# Patient Record
Sex: Male | Born: 1945 | Race: White | Hispanic: No | Marital: Married | Smoking: Former smoker
Health system: Southern US, Community
[De-identification: ages and names within clinical notes are randomized; demographics above are authoritative.]

## PROBLEM LIST (undated history)

## (undated) DIAGNOSIS — G4733 Obstructive sleep apnea (adult) (pediatric): Secondary | ICD-10-CM

## (undated) DIAGNOSIS — J449 Chronic obstructive pulmonary disease, unspecified: Secondary | ICD-10-CM

## (undated) DIAGNOSIS — I10 Essential (primary) hypertension: Secondary | ICD-10-CM

## (undated) DIAGNOSIS — J9621 Acute and chronic respiratory failure with hypoxia: Secondary | ICD-10-CM

## (undated) DIAGNOSIS — I2729 Other secondary pulmonary hypertension: Secondary | ICD-10-CM

## (undated) DIAGNOSIS — G4730 Sleep apnea, unspecified: Secondary | ICD-10-CM

## (undated) DIAGNOSIS — I5032 Chronic diastolic (congestive) heart failure: Secondary | ICD-10-CM

## (undated) HISTORY — PX: CERVICAL LAMINECTOMY: SHX94

---

## 2010-10-11 ENCOUNTER — Encounter: Payer: Self-pay | Admitting: Emergency Medicine

## 2010-10-11 ENCOUNTER — Inpatient Hospital Stay (INDEPENDENT_AMBULATORY_CARE_PROVIDER_SITE_OTHER)
Admission: RE | Admit: 2010-10-11 | Discharge: 2010-10-11 | Disposition: A | Discharge status: Home / Self Care | Payer: Commercial Managed Care - PPO | Source: Ambulatory Visit | Attending: Emergency Medicine | Admitting: Emergency Medicine

## 2010-10-11 DIAGNOSIS — E119 Type 2 diabetes mellitus without complications: Secondary | ICD-10-CM | POA: Insufficient documentation

## 2010-10-11 DIAGNOSIS — J441 Chronic obstructive pulmonary disease with (acute) exacerbation: Secondary | ICD-10-CM

## 2010-10-11 DIAGNOSIS — J4489 Other specified chronic obstructive pulmonary disease: Secondary | ICD-10-CM

## 2010-10-11 DIAGNOSIS — J449 Chronic obstructive pulmonary disease, unspecified: Secondary | ICD-10-CM | POA: Insufficient documentation

## 2010-10-11 LAB — CONVERTED CEMR LAB: Blood Glucose, Fingerstick: 134

## 2011-01-14 NOTE — Progress Notes (Signed)
Summary: Posssible Bronchitis (rm 2)   Vital Signs:  Patient Profile:   65 Years Old Male CC:      productive cough, SOB, fatigue, wheezing x 4 days Height:     69 inches Weight:      331 pounds O2 Sat:      68 % O2 treatment:    Room Air Temp:     98.1 degrees F oral Pulse rate:   81 / minute Resp:     30 per minute BP sitting:   98 / 63  (left arm) Cuff size:   large  Vitals Entered By: Lajean Saver RN (October 11, 2010 11:37 AM)             CBG Result 134      Updated Prior Medication List: LISINOPRIL-HYDROCHLOROTHIAZIDE 20-12.5 MG TABS (LISINOPRIL-HYDROCHLOROTHIAZIDE) once daily LASIX 40 MG TABS (FUROSEMIDE) once daily SIMVASTATIN 10 MG TABS (SIMVASTATIN) once daily GLIPIZIDE 10 MG XR24H-TAB (GLIPIZIDE) once daily ASPIRIN LOW STRENGTH 81 MG CHEW (ASPIRIN) once daily SPIRIVA HANDIHALER 18 MCG CAPS (TIOTROPIUM BROMIDE MONOHYDRATE)  ALBUTEROL SULFATE (5 MG/ML) 0.5% NEBU (ALBUTEROL SULFATE) prn  Current Allergies: No known allergies History of Present Illness History from: patient Chief Complaint: productive cough, SOB, fatigue, wheezing x 4 days History of Present Illness: Pt complains of four days of worsening productive cough, dyspnea, fatigue and wheezing. He has chronic COPD, on home oxygen. His usual oxygen saturation on room air is 92%. His PCP is Dr. Pia Mau at [facility] family [facility], and he gets these kind of flare ups about once a year, and he is usually prescribed an antibiotic, prednisone dose pack, and given a shot of an antibiotic and steroid. He uses a home nebulizer also, but has not been using it recently. Denies nausea or vomiting. Denies leg pain or edema. No chest pain. He states that he absolutely refuses a chest x-ray or hospitalization under any circumstances, even if his condition were to worsen and even if he were to die from his condition. No sore throat. + cough, + dyspnea. No exertional chest pain. positive wheezing.  No nausea No  vomiting. + fever, No chills has type II diabetes is controlled, CBG today in the office 134.   REVIEW OF SYSTEMS Constitutional Symptoms       Complains of chills and fatigue.     Denies fever, night sweats, weight loss, and weight gain.  Eyes       Denies change in vision, eye pain, eye discharge, glasses, contact lenses, and eye surgery. Ear/Nose/Throat/Mouth       Complains of frequent runny nose.      Denies hearing loss/aids, change in hearing, ear pain, ear discharge, dizziness, frequent nose bleeds, sinus problems, sore throat, hoarseness, and tooth pain or bleeding.  Respiratory       Complains of productive cough, wheezing, and shortness of breath.      Denies dry cough, asthma, bronchitis, and emphysema/COPD.  Cardiovascular       Denies murmurs, chest pain, and tires easily with exhertion.    Gastrointestinal       Denies stomach pain, nausea/vomiting, diarrhea, constipation, blood in bowel movements, and indigestion. Genitourniary       Denies painful urination, kidney stones, and loss of urinary control. Neurological       Complains of headaches.      Denies paralysis, seizures, and fainting/blackouts. Musculoskeletal       Denies muscle pain, joint pain, joint stiffness, decreased range of motion, redness, swelling, muscle  weakness, and gout.  Skin       Denies bruising, unusual mles/lumps or sores, and hair/skin or nail changes.  Psych       Denies mood changes, temper/anger issues, anxiety/stress, speech problems, depression, and sleep problems. Other Comments: Initial 02sat 68% Placed on NRB, 02 increased to 95%. Symptoms x 4 days. Wheezing in both lungs   Past History:  Past Medical History: COPD Diabetes mellitus, type II CPAP  Past Surgical History: Tonsillectomy cervical surgery  Social History: Married Current Smoker 1PPD x 20 years Alcohol use-no Drug use-no Regular exercise-no Smoking Status:  current Drug Use:  no Does Patient Exercise:   no Physical Exam General appearance: initially, very dyspneic, but alert, not toxic. Placed on 02 immediately NRB, and O2 saturation improved to 95% Head: normocephalic, atraumatic Eyes: conjunctivae and lids normal Ears: normal, no lesions or deformities Nasal: mucosa pink, nonedematous, no septal deviation, turbinates normal Oral/Pharynx: tongue normal, posterior pharynx without erythema or exudate Neck: neck supple,  trachea midline, no masses. no JVD Chest/Lungs: scattered wheezes throughout all lobes. Scattered rhonchi throughout. Initially, poor air movement. No rales. Heart: regular rate and  rhythm, no murmur Abdomen: soft, non-tender without obvious organomegaly Extremities: normal extremities. No calf  tenderness or edema Skin: no obvious rashes or lesions MSE: oriented to time, place, and person Assessment New Problems: CHRONIC OBSTRUCTIVE PULMONARY DISEASE, ACUTE EXACERBATION (ICD-491.21) DIABETES MELLITUS, TYPE II (ICD-250.00) COPD (ICD-496)   Plan New Orders: Pulse Oximetry [94760] Admin of Therapeutic Inj  intramuscular or subcutaneous [96372] Nebulizer Tx [94640] Albuterol up to 2.5mg  with Ipratropium [J7620] Depo- Medrol 80mg  [J1040] New Patient Level IV [99204] Capillary Blood Glucose/CBG [82948] Rocephin  250mg  [J0696] Planning Comments:   Depo-Medrol 80 mg IM.  Oxygen, 6 L per minute, via non re-breathing mask. duoNeb Neb tx. Patient reevaluated afterward, his breathing significantly improved. Has my late exp wheezes and few rhonchi afterward but air movement much improved.  no rales. On his portable oxygen, 4 L per minute via nasal cannula, we rechecked his oxygen saturation which stabilized at 94%, in my opinion he was stabilized to go home at his request. Again, he absolutely refused any further treatment, although I explained the risks. He voiced understanding. Rocephin 1 g I M. I prescribed Levaquin and prednisone dose pack. continue home nebulizers as  before. Continue home oxygen, titrate to oxygen saturation as usual in the 92-93% range.  Follow Up: Dr, Pia Mau at [facility] family practice tomorrow. If any worsening in the meantime, go to ER stat.  The patient and/or caregiver has been counseled thoroughly with regard to medications prescribed including dosage, schedule, interactions, rationale for use, and possible side effects and they verbalize understanding.  Diagnoses and expected course of recovery discussed and will return if not improved as expected or if the condition worsens. Patient and/or caregiver verbalized understanding.   Medication Administration  Injection # 1:    Medication: Depo- Medrol 80mg     Diagnosis: COPD (ICD-496)    Route: IM    Site: R deltoid    Exp Date: 01/12/2011    Lot #: [id number]    Mfr: Pharmacia    Patient tolerated injection without complications    Given by: Lajean Saver RN (October 11, 2010 12:40 PM)  Injection # 2:    Medication: Rocephin  250mg     Diagnosis: DIABETES MELLITUS, TYPE II (ICD-250.00)    Route: IM    Site: RUOQ gluteus    Exp Date: 02/11/2013    Lot #: [id number]  Mfr: sandoz    Patient tolerated injection without complications    Given by: Lajean Saver RN (October 11, 2010 1:31 PM)  Orders Added: 1)  Pulse Oximetry [94760] 2)  Admin of Therapeutic Inj  intramuscular or subcutaneous [96372] 3)  Nebulizer Tx [86578] 4)  Albuterol up to 2.5mg  with Ipratropium [J7620] 5)  Depo- Medrol 80mg  [J1040] 6)  New Patient Level IV [99204] 7)  Capillary Blood Glucose/CBG [82948] 8)  Rocephin  250mg  [J0696]    Laboratory Results   Blood Tests   Date/Time Received: October 11, 2010 12:10 PM  Date/Time Reported: October 11, 2010 12:10 PM   CBG Random:: 134mg /dL       Appended Document: Posssible Bronchitis (rm 2) clarifications of Rx's handwritten and visit: Levaquin 500 mg,  #10,  no refills, Sig: 1 by mouth q day X 10 days. Prednisone 10 mg. , # 21. No rf. Taper dose  pack times six days.

## 2011-06-15 ENCOUNTER — Emergency Department (HOSPITAL_BASED_OUTPATIENT_CLINIC_OR_DEPARTMENT_OTHER)
Admission: EM | Admit: 2011-06-15 | Discharge: 2011-06-16 | Disposition: A | Discharge status: Home / Self Care | Payer: Medicare Other | Attending: Emergency Medicine | Admitting: Emergency Medicine

## 2011-06-15 ENCOUNTER — Encounter (HOSPITAL_BASED_OUTPATIENT_CLINIC_OR_DEPARTMENT_OTHER): Payer: Self-pay | Admitting: *Deleted

## 2011-06-15 DIAGNOSIS — J4489 Other specified chronic obstructive pulmonary disease: Secondary | ICD-10-CM | POA: Insufficient documentation

## 2011-06-15 DIAGNOSIS — E119 Type 2 diabetes mellitus without complications: Secondary | ICD-10-CM | POA: Insufficient documentation

## 2011-06-15 DIAGNOSIS — Z794 Long term (current) use of insulin: Secondary | ICD-10-CM | POA: Insufficient documentation

## 2011-06-15 DIAGNOSIS — Z79899 Other long term (current) drug therapy: Secondary | ICD-10-CM | POA: Insufficient documentation

## 2011-06-15 DIAGNOSIS — G4730 Sleep apnea, unspecified: Secondary | ICD-10-CM | POA: Insufficient documentation

## 2011-06-15 DIAGNOSIS — Y92009 Unspecified place in unspecified non-institutional (private) residence as the place of occurrence of the external cause: Secondary | ICD-10-CM | POA: Insufficient documentation

## 2011-06-15 DIAGNOSIS — W268XXA Contact with other sharp object(s), not elsewhere classified, initial encounter: Secondary | ICD-10-CM | POA: Insufficient documentation

## 2011-06-15 DIAGNOSIS — J449 Chronic obstructive pulmonary disease, unspecified: Secondary | ICD-10-CM | POA: Insufficient documentation

## 2011-06-15 DIAGNOSIS — S81809A Unspecified open wound, unspecified lower leg, initial encounter: Secondary | ICD-10-CM | POA: Insufficient documentation

## 2011-06-15 DIAGNOSIS — Y93H2 Activity, gardening and landscaping: Secondary | ICD-10-CM | POA: Insufficient documentation

## 2011-06-15 DIAGNOSIS — I10 Essential (primary) hypertension: Secondary | ICD-10-CM | POA: Insufficient documentation

## 2011-06-15 DIAGNOSIS — S81812A Laceration without foreign body, left lower leg, initial encounter: Secondary | ICD-10-CM

## 2011-06-15 DIAGNOSIS — S81009A Unspecified open wound, unspecified knee, initial encounter: Secondary | ICD-10-CM | POA: Insufficient documentation

## 2011-06-15 HISTORY — DX: Chronic obstructive pulmonary disease, unspecified: J44.9

## 2011-06-15 HISTORY — DX: Essential (primary) hypertension: I10

## 2011-06-15 HISTORY — DX: Sleep apnea, unspecified: G47.30

## 2011-06-15 HISTORY — DX: Other secondary pulmonary hypertension: I27.29

## 2011-06-15 MED ORDER — LIDOCAINE HCL 2 % IJ SOLN
20.0000 mL | Freq: Once | INTRAMUSCULAR | Status: AC
  Administered 2011-06-15: 240 mg

## 2011-06-15 MED ORDER — TETANUS-DIPHTH-ACELL PERTUSSIS 5-2.5-18.5 LF-MCG/0.5 IM SUSP
0.5000 mL | Freq: Once | INTRAMUSCULAR | Status: AC
  Administered 2011-06-15: 0.5 mL via INTRAMUSCULAR
  Filled 2011-06-15: qty 0.5

## 2011-06-15 MED ORDER — LIDOCAINE HCL 2 % IJ SOLN
INTRAMUSCULAR | Status: AC
  Administered 2011-06-15: 240 mg
  Filled 2011-06-15: qty 1

## 2011-06-15 NOTE — ED Notes (Signed)
Patient was breathing heavy in triage and had low pulse ox of 79-89%. Patient stated he has oxygen in his car but not with him. Patient stated he is normally on 3 litres at home.

## 2011-06-15 NOTE — ED Notes (Signed)
Pt states that while mowing this Pm he got caught on a briar bush presents with cut on left lower leg. Pt with large amount of fluid seeping from leg

## 2011-06-15 NOTE — Discharge Instructions (Signed)
Take an extra dose of your lasix this evening and elevate legs as often as possible.  Follow up with your primary care doctor or [facility] [phone]; [address])  in 7-10 days for wound recheck and suture removal.  You should be seen sooner if you develop fever, worsening pain or redness/drainage of pus at site of wound.   You should also be seen sooner if you develop worse than baseline shortness of breath or chest pain. Laceration Care, Adult A laceration is a cut or lesion that goes through all layers of the skin and into the tissue just beneath the skin. TREATMENT  Some lacerations may not require closure. Some lacerations may not be able to be closed due to an increased risk of infection. It is important to see your caregiver as soon as possible after an injury to minimize the risk of infection and maximize the opportunity for successful closure. If closure is appropriate, pain medicines may be given, if needed. The wound will be cleaned to help prevent infection. Your caregiver will use stitches (sutures), staples, wound glue (adhesive), or skin adhesive strips to repair the laceration. These tools bring the skin edges together to allow for faster healing and a better cosmetic outcome. However, all wounds will heal with a scar. Once the wound has healed, scarring can be minimized by covering the wound with sunscreen during the day for 1 full year. HOME CARE INSTRUCTIONS  For sutures or staples:  Keep the wound clean and dry.   If you were given a bandage (dressing), you should change it at least once a day. Also, change the dressing if it becomes wet or dirty, or as directed by your caregiver.   Wash the wound with soap and water 2 times a day. Rinse the wound off with water to remove all soap. Pat the wound dry with a clean towel.   After cleaning, apply a thin layer of the antibiotic ointment as recommended by your caregiver. This will help prevent infection and keep the  dressing from sticking.   You may shower as usual after the first 24 hours. Do not soak the wound in water until the sutures are removed.   Only take over-the-counter or prescription medicines for pain, discomfort, or fever as directed by your caregiver.   Get your sutures or staples removed as directed by your caregiver.  For skin adhesive strips:  Keep the wound clean and dry.   Do not get the skin adhesive strips wet. You may bathe carefully, using caution to keep the wound dry.   If the wound gets wet, pat it dry with a clean towel.   Skin adhesive strips will fall off on their own. You may trim the strips as the wound heals. Do not remove skin adhesive strips that are still stuck to the wound. They will fall off in time.  For wound adhesive:  You may briefly wet your wound in the shower or bath. Do not soak or scrub the wound. Do not swim. Avoid periods of heavy perspiration until the skin adhesive has fallen off on its own. After showering or bathing, gently pat the wound dry with a clean towel.   Do not apply liquid medicine, cream medicine, or ointment medicine to your wound while the skin adhesive is in place. This may loosen the film before your wound is healed.   If a dressing is placed over the wound, be careful not to apply tape directly over  the skin adhesive. This may cause the adhesive to be pulled off before the wound is healed.   Avoid prolonged exposure to sunlight or tanning lamps while the skin adhesive is in place. Exposure to ultraviolet light in the first year will darken the scar.   The skin adhesive will usually remain in place for 5 to 10 days, then naturally fall off the skin. Do not pick at the adhesive film.  You may need a tetanus shot if:  You cannot remember when you had your last tetanus shot.   You have never had a tetanus shot.  If you get a tetanus shot, your arm may swell, get red, and feel warm to the touch. This is common and not a problem. If  you need a tetanus shot and you choose not to have one, there is a rare chance of getting tetanus. Sickness from tetanus can be serious. SEEK MEDICAL CARE IF:   You have redness, swelling, or increasing pain in the wound.   You see a red line that goes away from the wound.   You have yellowish-white fluid (pus) coming from the wound.   You have a fever.   You notice a bad smell coming from the wound or dressing.   Your wound breaks open before or after sutures have been removed.   You notice something coming out of the wound such as wood or glass.   Your wound is on your hand or foot and you cannot move a finger or toe.  SEEK IMMEDIATE MEDICAL CARE IF:   Your pain is not controlled with prescribed medicine.   You have severe swelling around the wound causing pain and numbness or a change in color in your arm, hand, leg, or foot.   Your wound splits open and starts bleeding.   You have worsening numbness, weakness, or loss of function of any joint around or beyond the wound.   You develop painful lumps near the wound or on the skin anywhere on your body.  MAKE SURE YOU:   Understand these instructions.   Will watch your condition.   Will get help right away if you are not doing well or get worse.  Document Released: 01/28/2005 Document Revised: 01/17/2011 Document Reviewed: 07/24/2010 [facility] Patient Information 2012 [place], [place].

## 2011-06-15 NOTE — ED Provider Notes (Signed)
History     CSN: [id number]  Arrival date & time 06/15/11  4098   First MD Initiated Contact with Patient 06/15/11 1956      Chief Complaint  Patient presents with  . Extremity Laceration    (Consider location/radiation/quality/duration/timing/severity/associated sxs/prior treatment) HPI History provided by pt.   Pt was mowing the lawn today and left lower leg brushed up against a rose bush.  Sustained a laceration lateral aspect of leg.  Pain and bleeding currently controlled.  His wife reports that she cleaned wound w/ water.  No associated sx.   Last tetanus unknown.  Pt has chronic peripheral edema secondary to CHF.  Swelling worse today than normal.  Has been compliant w/ his lasix and does not believe he has increased his salt intake recently.   Denies cough and SOB.    Past Medical History  Diagnosis Date  . Hypertension   . COPD (chronic obstructive pulmonary disease)   . Heart failure   . Pulmonary hypertensive venous disease   . Diabetes mellitus   . Sleep apnea     Past Surgical History  Procedure Date  . Cervical laminectomy     History reviewed. No pertinent family history.  History  Substance Use Topics  . Smoking status: Former Games developer  . Smokeless tobacco: Not on file  . Alcohol Use: No      Review of Systems  All other systems reviewed and are negative.    Allergies  Review of patient's allergies indicates no known allergies.  Home Medications   Current Outpatient Rx  Name Route Sig Dispense Refill  . ACETAMINOPHEN 500 MG PO TABS Oral Take 1,000 mg by mouth every 6 (six) hours as needed. For pain    . ALBUTEROL SULFATE HFA 108 (90 BASE) MCG/ACT IN AERS Inhalation Inhale 2 puffs into the lungs every 6 (six) hours as needed. For shortness of breath    . ASPIRIN 81 MG PO TABS Oral Take 81 mg by mouth daily.    . BUDESONIDE-FORMOTEROL FUMARATE 80-4.5 MCG/ACT IN AERO Inhalation Inhale 2 puffs into the lungs 2 (two) times daily.    . FUROSEMIDE 40  MG PO TABS Oral Take 40 mg by mouth daily.    Marland Kitchen GLIPIZIDE ER 10 MG PO TB24 Oral Take 10 mg by mouth daily.    . INSULIN ASPART 100 UNIT/ML [place] SOLN Subcutaneous Inject 2-4 Units into the skin 3 (three) times daily before meals. According to sliding scale    . INSULIN DETEMIR 100 UNIT/ML [place] SOLN Subcutaneous Inject 7 Units into the skin at bedtime.    Marland Kitchen LISINOPRIL-HYDROCHLOROTHIAZIDE 10-12.5 MG PO TABS Oral Take 1 tablet by mouth daily.    . ADULT MULTIVITAMIN W/MINERALS CH Oral Take 1 tablet by mouth daily.    Marland Kitchen SIMVASTATIN 10 MG PO TABS Oral Take 10 mg by mouth daily.    Marland Kitchen TIOTROPIUM BROMIDE MONOHYDRATE 18 MCG IN CAPS Inhalation Place 18 mcg into inhaler and inhale daily.    Marland Kitchen PREDNISONE (PAK) PO Oral Take by mouth daily. Taper dose completed on Monday 06/10/11      BP 142/55  Pulse 93  Temp 97.5 F (36.4 C)  Resp 24  SpO2 94%  Physical Exam  Nursing note and vitals reviewed. Constitutional: He is oriented to person, place, and time. He appears well-developed and well-nourished. No distress.  HENT:  Head: Normocephalic and atraumatic.  Eyes:       Normal appearance  Neck: Normal range of motion.  Pulmonary/Chest:  Effort normal.  Musculoskeletal: Normal range of motion.  Neurological: He is alert and oriented to person, place, and time.  Skin:       Skin discoloration and thickening bilateral anterior lower legs, consistent w/ chronic venous insufficiency.  2+ pitting edema, worse on left than right.  Pt reports that left lower leg is always more edematous.  Jagged linear, subcutaneous laceration mid-left lateral lower leg.  Hemostatic but weeping clear fluid.  Mildly ttp.   Psychiatric: He has a normal mood and affect. His behavior is normal.    ED Course  Procedures (including critical care time)  LACERATION REPAIR Performed by: Otilio Miu Authorized by: Otilio Miu Consent: Verbal consent obtained. Risks and benefits: risks, benefits and alternatives  were discussed Consent given by: patient Patient identity confirmed: provided demographic data Prepped and Draped in normal sterile fashion Wound explored  Laceration Location: left anterior lower leg  Laceration Length: 10cm  No Foreign Bodies seen or palpated  Anesthesia: local infiltration  Local anesthetic: lidocaine 2% w/out epinephrine  Anesthetic total: 12 ml  Irrigation method: syringe Amount of cleaning: standard  Skin closure: prolene 4.0  Number of sutures: 12  Technique: simple interrupted  Patient tolerance: Patient tolerated the procedure well with no immediate complications.  Labs Reviewed - No data to display No results found.   1. Laceration of left lower leg       MDM  Pt presents w/ laceration left anterior lower leg.  Wound cleaned and sutured.  Tetanus updated.  On exam, significant pitting edema of bilateral LEs, left worse than right.  Pt reports that edema is always worse on the left, but edema both legs is worse than normal today.  H/o CHF for which he takes 40mg  lasix qam.   Has been compliant w/ meds and denies cough and worse than baseline SOB.  Recommended that he take a second dose of his lasix when he returns home this evening, elevate legs and f/u with his PCP if swelling or dyspnea worsens.          Otilio Miu, [place] 06/16/11 [phone]

## 2011-06-15 NOTE — ED Notes (Signed)
[place] EDPA at bedside to suture

## 2011-06-16 NOTE — ED Provider Notes (Signed)
Medical screening examination/treatment/procedure(s) were performed by non-physician practitioner and as supervising physician I was immediately available for consultation/collaboration.   Gavin Pound. Oletta Lamas, MD 06/16/11 2049

## 2018-04-24 ENCOUNTER — Inpatient Hospital Stay
Admission: RE | Admit: 2018-04-24 | Discharge: 2018-05-15 | Disposition: A | Discharge status: Skilled Nursing Facility | Payer: Medicare Other | Attending: Internal Medicine | Admitting: Internal Medicine

## 2018-04-24 DIAGNOSIS — I2729 Other secondary pulmonary hypertension: Secondary | ICD-10-CM | POA: Diagnosis present

## 2018-04-24 DIAGNOSIS — J9690 Respiratory failure, unspecified, unspecified whether with hypoxia or hypercapnia: Secondary | ICD-10-CM

## 2018-04-24 DIAGNOSIS — J9621 Acute and chronic respiratory failure with hypoxia: Secondary | ICD-10-CM | POA: Diagnosis present

## 2018-04-24 DIAGNOSIS — I5032 Chronic diastolic (congestive) heart failure: Secondary | ICD-10-CM | POA: Diagnosis present

## 2018-04-24 DIAGNOSIS — I509 Heart failure, unspecified: Secondary | ICD-10-CM

## 2018-04-24 DIAGNOSIS — J449 Chronic obstructive pulmonary disease, unspecified: Secondary | ICD-10-CM | POA: Diagnosis present

## 2018-04-24 DIAGNOSIS — T17908A Unspecified foreign body in respiratory tract, part unspecified causing other injury, initial encounter: Secondary | ICD-10-CM

## 2018-04-24 DIAGNOSIS — G4733 Obstructive sleep apnea (adult) (pediatric): Secondary | ICD-10-CM | POA: Diagnosis present

## 2018-04-24 HISTORY — DX: Acute and chronic respiratory failure with hypoxia: J96.21

## 2018-04-24 HISTORY — DX: Chronic diastolic (congestive) heart failure: I50.32

## 2018-04-24 HISTORY — DX: Other secondary pulmonary hypertension: I27.29

## 2018-04-24 HISTORY — DX: Obstructive sleep apnea (adult) (pediatric): G47.33

## 2018-04-24 LAB — BLOOD GAS, ARTERIAL
ACID-BASE EXCESS: 12.6 mmol/L — AB (ref 0.0–2.0)
Bicarbonate: 37.8 mmol/L — ABNORMAL HIGH (ref 20.0–28.0)
O2 Content: 10 L/min
O2 Saturation: 92.6 %
PATIENT TEMPERATURE: 98.6
PH ART: 7.406 (ref 7.350–7.450)
pCO2 arterial: 61.5 mmHg — ABNORMAL HIGH (ref 32.0–48.0)
pO2, Arterial: 66.1 mmHg — ABNORMAL LOW (ref 83.0–108.0)

## 2018-04-24 MED ORDER — FAMOTIDINE 20 MG PO TABS
20.00 | ORAL_TABLET | ORAL | Status: DC
Start: 2018-04-24 — End: 2018-04-24

## 2018-04-24 MED ORDER — NITROGLYCERIN 0.4 MG SL SUBL
0.40 | SUBLINGUAL_TABLET | SUBLINGUAL | Status: DC
Start: ? — End: 2018-04-24

## 2018-04-24 MED ORDER — POLYETHYLENE GLYCOL 3350 17 G PO PACK
17.00 | PACK | ORAL | Status: DC
Start: ? — End: 2018-04-24

## 2018-04-24 MED ORDER — SODIUM CHLORIDE 0.9 % IV SOLN
10.00 | INTRAVENOUS | Status: DC
Start: ? — End: 2018-04-24

## 2018-04-24 MED ORDER — CLOTRIMAZOLE-BETAMETHASONE 1-0.05 % EX CREA
TOPICAL_CREAM | CUTANEOUS | Status: DC
Start: 2018-04-24 — End: 2018-04-24

## 2018-04-24 MED ORDER — IPRATROPIUM-ALBUTEROL 0.5-2.5 (3) MG/3ML IN SOLN
3.00 | RESPIRATORY_TRACT | Status: DC
Start: ? — End: 2018-04-24

## 2018-04-24 MED ORDER — FERROUS SULFATE 325 (65 FE) MG PO TABS
325.00 | ORAL_TABLET | ORAL | Status: DC
Start: 2018-04-25 — End: 2018-04-24

## 2018-04-24 MED ORDER — POLYETHYLENE GLYCOL 3350 17 G PO PACK
17.00 | PACK | ORAL | Status: DC
Start: 2018-04-25 — End: 2018-04-24

## 2018-04-24 MED ORDER — INSULIN GLARGINE 100 UNIT/ML ~~LOC~~ SOLN
1.00 | SUBCUTANEOUS | Status: DC
Start: ? — End: 2018-04-24

## 2018-04-24 MED ORDER — GENERIC EXTERNAL MEDICATION
1.00 | Status: DC
Start: ? — End: 2018-04-24

## 2018-04-24 MED ORDER — ENOXAPARIN SODIUM 40 MG/0.4ML ~~LOC~~ SOLN
40.00 | SUBCUTANEOUS | Status: DC
Start: 2018-04-24 — End: 2018-04-24

## 2018-04-24 MED ORDER — ASPIRIN 81 MG PO CHEW
81.00 | CHEWABLE_TABLET | ORAL | Status: DC
Start: 2018-04-25 — End: 2018-04-24

## 2018-04-24 MED ORDER — GENERIC EXTERNAL MEDICATION
10.00 | Status: DC
Start: ? — End: 2018-04-24

## 2018-04-24 MED ORDER — BENZONATATE 100 MG PO CAPS
100.00 | ORAL_CAPSULE | ORAL | Status: DC
Start: ? — End: 2018-04-24

## 2018-04-24 MED ORDER — FUROSEMIDE 10 MG/ML IJ SOLN
40.00 | INTRAMUSCULAR | Status: DC
Start: 2018-04-25 — End: 2018-04-24

## 2018-04-24 MED ORDER — GENERIC EXTERNAL MEDICATION
3.38 | Status: DC
Start: 2018-04-25 — End: 2018-04-24

## 2018-04-24 MED ORDER — BUDESONIDE-FORMOTEROL FUMARATE 80-4.5 MCG/ACT IN AERO
2.00 | INHALATION_SPRAY | RESPIRATORY_TRACT | Status: DC
Start: 2018-04-24 — End: 2018-04-24

## 2018-04-24 MED ORDER — INSULIN LISPRO 100 UNIT/ML ~~LOC~~ SOLN
1.00 | SUBCUTANEOUS | Status: DC
Start: 2018-04-24 — End: 2018-04-24

## 2018-04-24 MED ORDER — LORAZEPAM 2 MG/ML IJ SOLN
1.00 | INTRAMUSCULAR | Status: DC
Start: ? — End: 2018-04-24

## 2018-04-24 MED ORDER — TEMAZEPAM 7.5 MG PO CAPS
7.50 | ORAL_CAPSULE | ORAL | Status: DC
Start: ? — End: 2018-04-24

## 2018-04-24 MED ORDER — GENERIC EXTERNAL MEDICATION
650.00 | Status: DC
Start: ? — End: 2018-04-24

## 2018-04-24 MED ORDER — GENERIC EXTERNAL MEDICATION
4.00 | Status: DC
Start: ? — End: 2018-04-24

## 2018-04-24 MED ORDER — INSULIN LISPRO 100 UNIT/ML ~~LOC~~ SOLN
1.00 | SUBCUTANEOUS | Status: DC
Start: ? — End: 2018-04-24

## 2018-04-24 MED ORDER — ACETAMINOPHEN 325 MG PO TABS
650.00 | ORAL_TABLET | ORAL | Status: DC
Start: ? — End: 2018-04-24

## 2018-04-25 ENCOUNTER — Other Ambulatory Visit (HOSPITAL_COMMUNITY): Payer: Self-pay

## 2018-04-25 LAB — URINALYSIS, ROUTINE W REFLEX MICROSCOPIC
Bilirubin Urine: NEGATIVE
Glucose, UA: NEGATIVE mg/dL
Ketones, ur: NEGATIVE mg/dL
Nitrite: NEGATIVE
Protein, ur: NEGATIVE mg/dL
Specific Gravity, Urine: 1.014 (ref 1.005–1.030)
pH: 7 (ref 5.0–8.0)

## 2018-04-25 LAB — COMPREHENSIVE METABOLIC PANEL
ALT: 24 U/L (ref 0–44)
AST: 30 U/L (ref 15–41)
Albumin: 2.8 g/dL — ABNORMAL LOW (ref 3.5–5.0)
Alkaline Phosphatase: 78 U/L (ref 38–126)
Anion gap: 11 (ref 5–15)
BUN: 18 mg/dL (ref 8–23)
CO2: 33 mmol/L — ABNORMAL HIGH (ref 22–32)
Calcium: 9.1 mg/dL (ref 8.9–10.3)
Chloride: 91 mmol/L — ABNORMAL LOW (ref 98–111)
Creatinine, Ser: 0.7 mg/dL (ref 0.61–1.24)
GFR calc Af Amer: >60 mL/min (ref >60)
GFR calc non Af Amer: >60 mL/min (ref >60)
Glucose, Bld: 179 mg/dL — ABNORMAL HIGH (ref 70–99)
Potassium: 3.9 mmol/L (ref 3.5–5.1)
Sodium: 135 mmol/L (ref 135–145)
Total Bilirubin: 0.4 mg/dL (ref 0.3–1.2)
Total Protein: 7.7 g/dL (ref 6.5–8.1)

## 2018-04-25 LAB — CBC
HCT: 35.7 % — ABNORMAL LOW (ref 39.0–52.0)
Hemoglobin: 10.8 g/dL — ABNORMAL LOW (ref 13.0–17.0)
MCH: 26.9 pg (ref 26.0–34.0)
MCHC: 30.3 g/dL (ref 30.0–36.0)
MCV: 89 fL (ref 80.0–100.0)
Platelets: 420 10*3/uL — ABNORMAL HIGH (ref 150–400)
RBC: 4.01 MIL/uL — ABNORMAL LOW (ref 4.22–5.81)
RDW: 17 % — ABNORMAL HIGH (ref 11.5–15.5)
WBC: 16.8 10*3/uL — ABNORMAL HIGH (ref 4.0–10.5)
nRBC: 0 % (ref 0.0–0.2)

## 2018-04-26 LAB — URINE CULTURE: Culture: <10000 — AB

## 2018-04-27 ENCOUNTER — Encounter: Payer: Self-pay | Admitting: Internal Medicine

## 2018-04-27 DIAGNOSIS — G4733 Obstructive sleep apnea (adult) (pediatric): Secondary | ICD-10-CM | POA: Diagnosis present

## 2018-04-27 DIAGNOSIS — I5032 Chronic diastolic (congestive) heart failure: Secondary | ICD-10-CM | POA: Diagnosis not present

## 2018-04-27 DIAGNOSIS — J9621 Acute and chronic respiratory failure with hypoxia: Secondary | ICD-10-CM | POA: Diagnosis not present

## 2018-04-27 DIAGNOSIS — J449 Chronic obstructive pulmonary disease, unspecified: Secondary | ICD-10-CM | POA: Diagnosis not present

## 2018-04-27 DIAGNOSIS — I2729 Other secondary pulmonary hypertension: Secondary | ICD-10-CM

## 2018-04-27 NOTE — Consult Note (Signed)
Pulmonary Critical Care Medicine [facility] [place]  PULMONARY SERVICE  Date of Service: 04/27/2018  PULMONARY CRITICAL CARE Wesley Rios  GSU:[id number]  DOB: 1945-09-04   DOA: 04/24/2018  Referring Physician: Carron Curie, MD  HPI: Wesley Rios is a 73 y.o. male seen for follow up of Acute on Chronic Respiratory Failure.  Patient has multiple medical problems including morbid obesity with sleep apnea hypoventilation syndrome chronic diastolic heart failure recurrent pneumonia hyperlipidemia hypertension aortic stenosis renal mass presumed to be renal cell carcinoma.  Patient apparently presented because of increasing weakness and difficulty getting around.  Apparently took a fall on the day of admission.  Patient also had been having a cough and some chills.  When he was evaluated he was noted to have an elevated white count and he was afebrile.  Chest x-ray showed some mild lower lobe opacities.  On initial evaluation he was found to have an elevated BNP of 1037 and oxygen requirements were still going up and saturations remained about 88%.  Patient was aggressively treated diuresed x-ray showed some improvement but his oxygen requirements went up still.  And he does have a history of noncompliance with OSA CPAP so was transferred to our facility because of the high oxygen requirements.  Goals of care were also discussed at the other facility and the wife was not ready yet to give up.  Review of Systems:  ROS performed and is unremarkable other than noted above.  Past Medical History:  Diagnosis Date  . COPD (chronic obstructive pulmonary disease)   . Diabetes mellitus   . Heart failure   . Hypertension   . Pulmonary hypertensive venous disease   . Sleep apnea     Past Surgical History:  Procedure Laterality Date  . CERVICAL LAMINECTOMY      Social History:    reports that he has quit smoking. He does not have any smokeless tobacco history on file. He  reports that he does not drink alcohol or use drugs.  Family History: Non-Contributory to the present illness  No Known Allergies  Medications: Reviewed on Rounds  Physical Exam:  Vitals: Temperature 98.1 pulse 91 respiratory rate 21 blood pressure 125/59 saturations 97%  Ventilator Settings currently off of the BiPAP he is using BiPAP at nighttime and 9 L during the daytime  . General: Comfortable at this time . Eyes: Grossly normal lids, irises & conjunctiva . ENT: grossly tongue is normal . Neck: no obvious mass . Cardiovascular: S1-S2 normal no gallop or rub is noted . Respiratory: Coarse breath sounds with a few rhonchi . Abdomen: Obese and soft . Skin: no rash seen on limited exam . Musculoskeletal: not rigid . Psychiatric:unable to assess . Neurologic: no seizure no involuntary movements         Labs on Admission:  Basic Metabolic Panel: Recent Labs  Lab 04/25/18 0105  NA 135  K 3.9  CL 91*  CO2 33*  GLUCOSE 179*  BUN 18  CREATININE 0.70  CALCIUM 9.1    Recent Labs  Lab 04/24/18 2255  PHART 7.406  PCO2ART 61.5*  PO2ART 66.1*  HCO3 37.8*  O2SAT 92.6    Liver Function Tests: Recent Labs  Lab 04/25/18 0105  AST 30  ALT 24  ALKPHOS 78  BILITOT 0.4  PROT 7.7  ALBUMIN 2.8*   No results for input(s): LIPASE, AMYLASE in the last 168 hours. No results for input(s): AMMONIA in the last 168 hours.  CBC: Recent Labs  Lab 04/25/18 0105  WBC 16.8*  HGB 10.8*  HCT 35.7*  MCV 89.0  PLT 420*    Cardiac Enzymes: No results for input(s): CKTOTAL, CKMB, CKMBINDEX, TROPONINI in the last 168 hours.  BNP (last 3 results) No results for input(s): BNP in the last 8760 hours.  ProBNP (last 3 results) No results for input(s): PROBNP in the last 8760 hours.   Radiological Exams on Admission: Dg Chest Port 1 View  Result Date: 04/25/2018 CLINICAL DATA:  Respiratory failure EXAM: PORTABLE CHEST 1 VIEW COMPARISON:  None. FINDINGS: Patient is rotated  and leaning to the RIGHT. Cardiomegaly and pulmonary vascular congestion identified. Moderate opacities overlying the LOWER lungs may represent atelectasis, consolidation and/or effusions. No pneumothorax. No acute bony abnormality.  Cervical fusion hardware noted. IMPRESSION: Cardiomegaly and pulmonary vascular congestion. Moderate opacities overlying the LOWER lungs which may represent atelectasis, consolidation and/or effusions. Electronically Signed   By: Harmon Pier M.D.   On: 04/25/2018 08:31    Assessment/Plan Active Problems:   Acute on chronic respiratory failure with hypoxia (HCC)   COPD (chronic obstructive pulmonary disease) (HCC)   Other secondary pulmonary hypertension (HCC)   Chronic diastolic heart failure (HCC)   Obstructive sleep apnea   1. Acute on chronic respiratory failure with hypoxia multifactorial patient has underlying chronic diastolic heart failure as well as pulmonary hypertension and noncompliance with the CPAP which is making his oxygenation issues worse.  Currently is on 9 L.  Patient is not really gaining much benefit is not able to come down on the FiO2.  Chest x-ray was done showed pulmonary vascular congestion and so therefore he might benefit from diuresis also he did have moderate opacities which are probably more related to his heart failure. 2. Pulmonary arterial hypertension with discussed with the primary care team regarding management.  Not sure if he would be a prime candidate for vasodilator therapy at this point.  Encourage use of oxygen and also encourage use CPAP BiPAP 3. Chronic diastolic heart failure diuretics as tolerated still has significant fluid overload noted. 4. Morbid obesity with sleep apnea hypopnea syndrome encourage compliance with the positive airway pressure as already discussed above 5. COPD severe disease by history with continue with nebulizers as tolerated.  I have personally seen and evaluated the patient, evaluated laboratory  and imaging results, formulated the assessment and plan and placed orders. The Patient requires high complexity decision making for assessment and support.  Case was discussed on Rounds with the Respiratory Therapy Staff Time Spent  Yevonne Pax, MD [facility] Pulmonary Critical Care Medicine Sleep Medicine

## 2018-04-28 DIAGNOSIS — I5032 Chronic diastolic (congestive) heart failure: Secondary | ICD-10-CM | POA: Diagnosis not present

## 2018-04-28 DIAGNOSIS — G4733 Obstructive sleep apnea (adult) (pediatric): Secondary | ICD-10-CM | POA: Diagnosis not present

## 2018-04-28 DIAGNOSIS — J9621 Acute and chronic respiratory failure with hypoxia: Secondary | ICD-10-CM | POA: Diagnosis not present

## 2018-04-28 DIAGNOSIS — J449 Chronic obstructive pulmonary disease, unspecified: Secondary | ICD-10-CM | POA: Diagnosis not present

## 2018-04-28 LAB — BASIC METABOLIC PANEL
Anion gap: 11 (ref 5–15)
BUN: 21 mg/dL (ref 8–23)
CO2: 31 mmol/L (ref 22–32)
Calcium: 9.6 mg/dL (ref 8.9–10.3)
Chloride: 93 mmol/L — ABNORMAL LOW (ref 98–111)
Creatinine, Ser: 0.79 mg/dL (ref 0.61–1.24)
GFR calc Af Amer: >60 mL/min (ref >60)
GFR calc non Af Amer: >60 mL/min (ref >60)
Glucose, Bld: 156 mg/dL — ABNORMAL HIGH (ref 70–99)
Potassium: 4.1 mmol/L (ref 3.5–5.1)
Sodium: 135 mmol/L (ref 135–145)

## 2018-04-28 LAB — CBC
HCT: 36.5 % — ABNORMAL LOW (ref 39.0–52.0)
Hemoglobin: 11 g/dL — ABNORMAL LOW (ref 13.0–17.0)
MCH: 26.9 pg (ref 26.0–34.0)
MCHC: 30.1 g/dL (ref 30.0–36.0)
MCV: 89.2 fL (ref 80.0–100.0)
Platelets: 384 10*3/uL (ref 150–400)
RBC: 4.09 MIL/uL — AB (ref 4.22–5.81)
RDW: 17.1 % — ABNORMAL HIGH (ref 11.5–15.5)
WBC: 11.2 10*3/uL — AB (ref 4.0–10.5)
nRBC: 0 % (ref 0.0–0.2)

## 2018-04-28 LAB — PHOSPHORUS: PHOSPHORUS: 3.7 mg/dL (ref 2.5–4.6)

## 2018-04-28 LAB — MAGNESIUM: Magnesium: 2.1 mg/dL (ref 1.7–2.4)

## 2018-04-28 NOTE — Progress Notes (Signed)
Pulmonary Critical Care Medicine [facility] [place]   PULMONARY CRITICAL CARE SERVICE  PROGRESS NOTE  Date of Service: 04/28/2018  Wesley Rios  JRP:[id number]  DOB: 10-22-1945   DOA: 04/24/2018  Referring Physician: Carron Curie, MD  HPI: Wesley Rios is a 73 y.o. male seen for follow up of Acute on Chronic Respiratory Failure.  Patient continues to refuse BiPAP right now is on 9 L by Oxymizer  Medications: Reviewed on Rounds  Physical Exam:  Vitals: Temperature 97.0 pulse 72 respiratory 20 blood pressure 125/65 saturations 95%  Ventilator Settings off the ventilator on 9 L Oxymizer  . General: Comfortable at this time . Eyes: Grossly normal lids, irises & conjunctiva . ENT: grossly tongue is normal . Neck: no obvious mass . Cardiovascular: S1 S2 normal no gallop . Respiratory: No rhonchi or rales are noted at this time . Abdomen: soft . Skin: no rash seen on limited exam . Musculoskeletal: not rigid . Psychiatric:unable to assess . Neurologic: no seizure no involuntary movements         Lab Data:   Basic Metabolic Panel: Recent Labs  Lab 04/25/18 0105 04/28/18 0444  NA 135 135  K 3.9 4.1  CL 91* 93*  CO2 33* 31  GLUCOSE 179* 156*  BUN 18 21  CREATININE 0.70 0.79  CALCIUM 9.1 9.6  MG  --  2.1  PHOS  --  3.7    ABG: Recent Labs  Lab 04/24/18 2255  PHART 7.406  PCO2ART 61.5*  PO2ART 66.1*  HCO3 37.8*  O2SAT 92.6    Liver Function Tests: Recent Labs  Lab 04/25/18 0105  AST 30  ALT 24  ALKPHOS 78  BILITOT 0.4  PROT 7.7  ALBUMIN 2.8*   No results for input(s): LIPASE, AMYLASE in the last 168 hours. No results for input(s): AMMONIA in the last 168 hours.  CBC: Recent Labs  Lab 04/25/18 0105 04/28/18 0444  WBC 16.8* 11.2*  HGB 10.8* 11.0*  HCT 35.7* 36.5*  MCV 89.0 89.2  PLT 420* 384    Cardiac Enzymes: No results for input(s): CKTOTAL, CKMB, CKMBINDEX, TROPONINI in the last 168 hours.  BNP (last 3 results) No  results for input(s): BNP in the last 8760 hours.  ProBNP (last 3 results) No results for input(s): PROBNP in the last 8760 hours.  Radiological Exams: No results found.  Assessment/Plan Active Problems:   Acute on chronic respiratory failure with hypoxia (HCC)   COPD (chronic obstructive pulmonary disease) (HCC)   Other secondary pulmonary hypertension (HCC)   Chronic diastolic heart failure (HCC)   Obstructive sleep apnea   1. Acute on chronic respiratory failure with hypoxia we will continue with Oxymizer titrate oxygen continue pulmonary toilet encourage compliance with BiPAP 2. COPD severe disease continue present management 3. Secondary pulmonary hypertension on oxygen therapy 4. Obstructive sleep apnea needs to be more compliant with positive airway pressure 5. Chronic diastolic heart failure diuretics as tolerated   I have personally seen and evaluated the patient, evaluated laboratory and imaging results, formulated the assessment and plan and placed orders. The Patient requires high complexity decision making for assessment and support.  Case was discussed on Rounds with the Respiratory Therapy Staff  Yevonne Pax, MD [facility] Pulmonary Critical Care Medicine Sleep Medicine

## 2018-04-30 ENCOUNTER — Other Ambulatory Visit (HOSPITAL_COMMUNITY): Payer: Self-pay

## 2018-04-30 DIAGNOSIS — G4733 Obstructive sleep apnea (adult) (pediatric): Secondary | ICD-10-CM | POA: Diagnosis not present

## 2018-04-30 DIAGNOSIS — J9621 Acute and chronic respiratory failure with hypoxia: Secondary | ICD-10-CM | POA: Diagnosis not present

## 2018-04-30 DIAGNOSIS — J449 Chronic obstructive pulmonary disease, unspecified: Secondary | ICD-10-CM | POA: Diagnosis not present

## 2018-04-30 DIAGNOSIS — I5032 Chronic diastolic (congestive) heart failure: Secondary | ICD-10-CM | POA: Diagnosis not present

## 2018-04-30 MED ORDER — GENERIC EXTERNAL MEDICATION
Status: DC
Start: ? — End: 2018-04-30

## 2018-04-30 NOTE — Progress Notes (Signed)
Pulmonary Critical Care Medicine [facility] [place]   PULMONARY CRITICAL CARE SERVICE  PROGRESS NOTE  Date of Service: 04/30/2018  Wesley Rios  [id number]:[id number]  DOB: 1945/06/08   DOA: 04/24/2018  Referring Physician: Carron Curie, MD  HPI: Wesley Rios is a 73 y.o. male seen for follow up of Acute on Chronic Respiratory Failure.  Patient right now is on 8 L using the Oxymizer.  Doing fairly well.  Medications: Reviewed on Rounds  Physical Exam:  Vitals: Temperature 97.8 pulse 67 respiratory 23 blood pressure 123/68 saturations 95%  Ventilator Settings on 8 L oxygen which will be continued at this time try to titrate as tolerated  . General: Comfortable at this time . Eyes: Grossly normal lids, irises & conjunctiva . ENT: grossly tongue is normal . Neck: no obvious mass . Cardiovascular: S1 S2 normal no gallop . Respiratory: Coarse breath sounds with few rhonchi noted at this time . Abdomen: soft . Skin: no rash seen on limited exam . Musculoskeletal: not rigid . Psychiatric:unable to assess . Neurologic: no seizure no involuntary movements         Lab Data:   Basic Metabolic Panel: Recent Labs  Lab 04/25/18 0105 04/28/18 0444  NA 135 135  K 3.9 4.1  CL 91* 93*  CO2 33* 31  GLUCOSE 179* 156*  BUN 18 21  CREATININE 0.70 0.79  CALCIUM 9.1 9.6  MG  --  2.1  PHOS  --  3.7    ABG: Recent Labs  Lab 04/24/18 2255  PHART 7.406  PCO2ART 61.5*  PO2ART 66.1*  HCO3 37.8*  O2SAT 92.6    Liver Function Tests: Recent Labs  Lab 04/25/18 0105  AST 30  ALT 24  ALKPHOS 78  BILITOT 0.4  PROT 7.7  ALBUMIN 2.8*   No results for input(s): LIPASE, AMYLASE in the last 168 hours. No results for input(s): AMMONIA in the last 168 hours.  CBC: Recent Labs  Lab 04/25/18 0105 04/28/18 0444  WBC 16.8* 11.2*  HGB 10.8* 11.0*  HCT 35.7* 36.5*  MCV 89.0 89.2  PLT 420* 384    Cardiac Enzymes: No results for input(s): CKTOTAL, CKMB, CKMBINDEX,  TROPONINI in the last 168 hours.  BNP (last 3 results) No results for input(s): BNP in the last 8760 hours.  ProBNP (last 3 results) No results for input(s): PROBNP in the last 8760 hours.  Radiological Exams: Dg Chest Port 1 View  Result Date: 04/30/2018 CLINICAL DATA:  CHF and shortness of breath EXAM: PORTABLE CHEST 1 VIEW COMPARISON:  04/25/2018 FINDINGS: Cardiomegaly, pulmonary vascular congestion and bibasilar opacities/atelectasis are not significantly changed. Probable effusions again noted. Little significant change from the prior study. No pneumothorax or acute bony abnormality. IMPRESSION: Little significant change in appearance of the chest with pulmonary vascular congestion and bibasilar opacities/atelectasis and probable effusions. Electronically Signed   By: Harmon Pier M.D.   On: 04/30/2018 10:03    Assessment/Plan Active Problems:   Acute on chronic respiratory failure with hypoxia (HCC)   COPD (chronic obstructive pulmonary disease) (HCC)   Other secondary pulmonary hypertension (HCC)   Chronic diastolic heart failure (HCC)   Obstructive sleep apnea   1. Acute on chronic respiratory failure hypoxia we will continue with trying to wean FiO2 down as tolerated continue secretion management supportive care. 2. COPD severe disease continue present therapy 3. Secondary pulmonary hypertension on oxygen therapy 4. Chronic diastolic heart failure diuretics as tolerated. 5. Obstructive sleep apnea encourage compliance with OSA therapy with CPAP  I have personally seen and evaluated the patient, evaluated laboratory and imaging results, formulated the assessment and plan and placed orders. The Patient requires high complexity decision making for assessment and support.  Case was discussed on Rounds with the Respiratory Therapy Staff  Allyne Gee, MD [facility] Pulmonary Critical Care Medicine Sleep Medicine

## 2018-05-01 ENCOUNTER — Other Ambulatory Visit (HOSPITAL_COMMUNITY): Payer: Self-pay

## 2018-05-01 DIAGNOSIS — J449 Chronic obstructive pulmonary disease, unspecified: Secondary | ICD-10-CM | POA: Diagnosis not present

## 2018-05-01 DIAGNOSIS — J9621 Acute and chronic respiratory failure with hypoxia: Secondary | ICD-10-CM | POA: Diagnosis not present

## 2018-05-01 DIAGNOSIS — G4733 Obstructive sleep apnea (adult) (pediatric): Secondary | ICD-10-CM | POA: Diagnosis not present

## 2018-05-01 DIAGNOSIS — I5032 Chronic diastolic (congestive) heart failure: Secondary | ICD-10-CM | POA: Diagnosis not present

## 2018-05-01 MED ORDER — GENERIC EXTERNAL MEDICATION
Status: DC
Start: ? — End: 2018-05-01

## 2018-05-01 NOTE — Progress Notes (Signed)
Pulmonary Critical Care Medicine [facility] [place]   PULMONARY CRITICAL CARE SERVICE  PROGRESS NOTE  Date of Service: 05/01/2018  Seab Rhame  KPV:[id number]  DOB: March 28, 1945   DOA: 04/24/2018  Referring Physician: Carron Curie, MD  HPI: Wesley Rios is a 73 y.o. male seen for follow up of Acute on Chronic Respiratory Failure.  Currently patient is on 7 L on the Oxymizer doing fairly well at this time  Medications: Reviewed on Rounds  Physical Exam:  Vitals: Temperature 97.7 pulse 78 respiratory 20 blood pressure 115/57 saturations 97%  Ventilator Settings currently off the ventilator on 7 L oxygen  . General: Comfortable at this time . Eyes: Grossly normal lids, irises & conjunctiva . ENT: grossly tongue is normal . Neck: no obvious mass . Cardiovascular: S1 S2 normal no gallop . Respiratory: Scattered rhonchi expansion is equal . Abdomen: soft . Skin: no rash seen on limited exam . Musculoskeletal: not rigid . Psychiatric:unable to assess . Neurologic: no seizure no involuntary movements         Lab Data:   Basic Metabolic Panel: Recent Labs  Lab 04/25/18 0105 04/28/18 0444  NA 135 135  K 3.9 4.1  CL 91* 93*  CO2 33* 31  GLUCOSE 179* 156*  BUN 18 21  CREATININE 0.70 0.79  CALCIUM 9.1 9.6  MG  --  2.1  PHOS  --  3.7    ABG: Recent Labs  Lab 04/24/18 2255  PHART 7.406  PCO2ART 61.5*  PO2ART 66.1*  HCO3 37.8*  O2SAT 92.6    Liver Function Tests: Recent Labs  Lab 04/25/18 0105  AST 30  ALT 24  ALKPHOS 78  BILITOT 0.4  PROT 7.7  ALBUMIN 2.8*   No results for input(s): LIPASE, AMYLASE in the last 168 hours. No results for input(s): AMMONIA in the last 168 hours.  CBC: Recent Labs  Lab 04/25/18 0105 04/28/18 0444  WBC 16.8* 11.2*  HGB 10.8* 11.0*  HCT 35.7* 36.5*  MCV 89.0 89.2  PLT 420* 384    Cardiac Enzymes: No results for input(s): CKTOTAL, CKMB, CKMBINDEX, TROPONINI in the last 168 hours.  BNP (last 3  results) No results for input(s): BNP in the last 8760 hours.  ProBNP (last 3 results) No results for input(s): PROBNP in the last 8760 hours.  Radiological Exams: Dg Chest Port 1 View  Result Date: 04/30/2018 CLINICAL DATA:  CHF and shortness of breath EXAM: PORTABLE CHEST 1 VIEW COMPARISON:  04/25/2018 FINDINGS: Cardiomegaly, pulmonary vascular congestion and bibasilar opacities/atelectasis are not significantly changed. Probable effusions again noted. Little significant change from the prior study. No pneumothorax or acute bony abnormality. IMPRESSION: Little significant change in appearance of the chest with pulmonary vascular congestion and bibasilar opacities/atelectasis and probable effusions. Electronically Signed   By: Harmon Pier M.D.   On: 04/30/2018 10:03    Assessment/Plan Active Problems:   Acute on chronic respiratory failure with hypoxia (HCC)   COPD (chronic obstructive pulmonary disease) (HCC)   Other secondary pulmonary hypertension (HCC)   Chronic diastolic heart failure (HCC)   Obstructive sleep apnea   1. Acute on chronic respiratory failure hypoxia we will continue with oxygen therapy try to titrate down as tolerated 2. COPD severe disease continue with supportive care 3. Chronic diastolic heart failure baseline 4. OSA has oxygen therapy encourage compliance 5. Secondary pulmonary hypertension treated with oxygen right now   I have personally seen and evaluated the patient, evaluated laboratory and imaging results, formulated the assessment and  plan and placed orders. The Patient requires high complexity decision making for assessment and support.  Case was discussed on Rounds with the Respiratory Therapy Staff  Allyne Gee, MD [facility] Pulmonary Critical Care Medicine Sleep Medicine

## 2018-05-02 DIAGNOSIS — J449 Chronic obstructive pulmonary disease, unspecified: Secondary | ICD-10-CM | POA: Diagnosis not present

## 2018-05-02 DIAGNOSIS — G4733 Obstructive sleep apnea (adult) (pediatric): Secondary | ICD-10-CM | POA: Diagnosis not present

## 2018-05-02 DIAGNOSIS — J9621 Acute and chronic respiratory failure with hypoxia: Secondary | ICD-10-CM | POA: Diagnosis not present

## 2018-05-02 DIAGNOSIS — I5032 Chronic diastolic (congestive) heart failure: Secondary | ICD-10-CM | POA: Diagnosis not present

## 2018-05-02 NOTE — Progress Notes (Signed)
Pulmonary Critical Care Medicine [facility] [place]   PULMONARY CRITICAL CARE SERVICE  PROGRESS NOTE  Date of Service: 05/02/2018  Wesley Rios  [id number]  DOB: Jun 25, 1945   DOA: 04/24/2018  Referring Physician: Carron Curie, MD  HPI: Wesley Rios is a 73 y.o. male seen for follow up of Acute on Chronic Respiratory Failure.  Patient is currently on nasal cannula and been encouraging to use BiPAP  Medications: Reviewed on Rounds  Physical Exam:  Vitals: Temperature 98.6 pulse 64 respiratory 14 blood pressure 125/66 saturations 94%  Ventilator Settings right now is on nasal cannula  . General: Comfortable at this time . Eyes: Grossly normal lids, irises & conjunctiva . ENT: grossly tongue is normal . Neck: no obvious mass . Cardiovascular: S1 S2 normal no gallop . Respiratory: No rhonchi or rales are noted at this time . Abdomen: soft . Skin: no rash seen on limited exam . Musculoskeletal: not rigid . Psychiatric:unable to assess . Neurologic: no seizure no involuntary movements         Lab Data:   Basic Metabolic Panel: Recent Labs  Lab 04/28/18 0444  NA 135  K 4.1  CL 93*  CO2 31  GLUCOSE 156*  BUN 21  CREATININE 0.79  CALCIUM 9.6  MG 2.1  PHOS 3.7    ABG: No results for input(s): PHART, PCO2ART, PO2ART, HCO3, O2SAT in the last 168 hours.  Liver Function Tests: No results for input(s): AST, ALT, ALKPHOS, BILITOT, PROT, ALBUMIN in the last 168 hours. No results for input(s): LIPASE, AMYLASE in the last 168 hours. No results for input(s): AMMONIA in the last 168 hours.  CBC: Recent Labs  Lab 04/28/18 0444  WBC 11.2*  HGB 11.0*  HCT 36.5*  MCV 89.2  PLT 384    Cardiac Enzymes: No results for input(s): CKTOTAL, CKMB, CKMBINDEX, TROPONINI in the last 168 hours.  BNP (last 3 results) No results for input(s): BNP in the last 8760 hours.  ProBNP (last 3 results) No results for input(s): PROBNP in the last 8760  hours.  Radiological Exams: No results found.  Assessment/Plan Active Problems:   Acute on chronic respiratory failure with hypoxia (HCC)   COPD (chronic obstructive pulmonary disease) (HCC)   Other secondary pulmonary hypertension (HCC)   Chronic diastolic heart failure (HCC)   Obstructive sleep apnea   1. Acute on chronic respiratory failure with hypoxia patient will continue with nasal cannula encourage compliance with BiPAP as tolerated. 2. COPD severe disease we will continue present management 3. Secondary pulmonary hypertension continue with supportive care 4. Chronic diastolic heart failure at baseline 5. Sleep apnea at baseline continue present management   I have personally seen and evaluated the patient, evaluated laboratory and imaging results, formulated the assessment and plan and placed orders. The Patient requires high complexity decision making for assessment and support.  Case was discussed on Rounds with the Respiratory Therapy Staff  Yevonne Pax, MD [facility] Pulmonary Critical Care Medicine Sleep Medicine

## 2018-05-03 DIAGNOSIS — G4733 Obstructive sleep apnea (adult) (pediatric): Secondary | ICD-10-CM | POA: Diagnosis not present

## 2018-05-03 DIAGNOSIS — J449 Chronic obstructive pulmonary disease, unspecified: Secondary | ICD-10-CM | POA: Diagnosis not present

## 2018-05-03 DIAGNOSIS — I5032 Chronic diastolic (congestive) heart failure: Secondary | ICD-10-CM | POA: Diagnosis not present

## 2018-05-03 DIAGNOSIS — J9621 Acute and chronic respiratory failure with hypoxia: Secondary | ICD-10-CM | POA: Diagnosis not present

## 2018-05-03 NOTE — Progress Notes (Signed)
Pulmonary Critical Care Medicine [facility] [place]   PULMONARY CRITICAL CARE SERVICE  PROGRESS NOTE  Date of Service: 05/03/2018  Wesley Rios  GBM:[id number]  DOB: 1945-06-11   DOA: 04/24/2018  Referring Physician: Carron Curie, MD  HPI: Wesley Rios is a 73 y.o. male seen for follow up of Acute on Chronic Respiratory Failure.  Patient is on Oxymizer has been on 7 L doing fairly well.  Medications: Reviewed on Rounds  Physical Exam:  Vitals: Temperature 96.6 pulse 60 respiratory 18 blood pressure 135/59 saturations 96%  Ventilator Settings currently off of the ventilator on Oxymizer  . General: Comfortable at this time . Eyes: Grossly normal lids, irises & conjunctiva . ENT: grossly tongue is normal . Neck: no obvious mass . Cardiovascular: S1 S2 normal no gallop . Respiratory: No rhonchi no rales are noted . Abdomen: soft . Skin: no rash seen on limited exam . Musculoskeletal: not rigid . Psychiatric:unable to assess . Neurologic: no seizure no involuntary movements         Lab Data:   Basic Metabolic Panel: Recent Labs  Lab 04/28/18 0444  NA 135  K 4.1  CL 93*  CO2 31  GLUCOSE 156*  BUN 21  CREATININE 0.79  CALCIUM 9.6  MG 2.1  PHOS 3.7    ABG: No results for input(s): PHART, PCO2ART, PO2ART, HCO3, O2SAT in the last 168 hours.  Liver Function Tests: No results for input(s): AST, ALT, ALKPHOS, BILITOT, PROT, ALBUMIN in the last 168 hours. No results for input(s): LIPASE, AMYLASE in the last 168 hours. No results for input(s): AMMONIA in the last 168 hours.  CBC: Recent Labs  Lab 04/28/18 0444  WBC 11.2*  HGB 11.0*  HCT 36.5*  MCV 89.2  PLT 384    Cardiac Enzymes: No results for input(s): CKTOTAL, CKMB, CKMBINDEX, TROPONINI in the last 168 hours.  BNP (last 3 results) No results for input(s): BNP in the last 8760 hours.  ProBNP (last 3 results) No results for input(s): PROBNP in the last 8760 hours.  Radiological  Exams: No results found.  Assessment/Plan Active Problems:   Acute on chronic respiratory failure with hypoxia (HCC)   COPD (chronic obstructive pulmonary disease) (HCC)   Other secondary pulmonary hypertension (HCC)   Chronic diastolic heart failure (HCC)   Obstructive sleep apnea   1. Acute on chronic respiratory failure with hypoxia we will continue with Oxymizer continue secretion management pulmonary toilet. 2. COPD severe disease we will continue present therapy 3. Secondary pulmonary hypertension at baseline continue oxygen therapy 4. Chronic diastolic heart failure diuretics as tolerated 5. Sleep apnea encourage positive airway pressure.   I have personally seen and evaluated the patient, evaluated laboratory and imaging results, formulated the assessment and plan and placed orders. The Patient requires high complexity decision making for assessment and support.  Case was discussed on Rounds with the Respiratory Therapy Staff  Yevonne Pax, MD [facility] Pulmonary Critical Care Medicine Sleep Medicine

## 2018-05-04 LAB — BASIC METABOLIC PANEL
Anion gap: 10 (ref 5–15)
BUN: 15 mg/dL (ref 8–23)
CO2: 32 mmol/L (ref 22–32)
Calcium: 9.1 mg/dL (ref 8.9–10.3)
Chloride: 92 mmol/L — ABNORMAL LOW (ref 98–111)
Creatinine, Ser: 0.8 mg/dL (ref 0.61–1.24)
GFR calc Af Amer: >60 mL/min (ref >60)
GFR calc non Af Amer: >60 mL/min (ref >60)
Glucose, Bld: 138 mg/dL — ABNORMAL HIGH (ref 70–99)
Potassium: 3.6 mmol/L (ref 3.5–5.1)
Sodium: 134 mmol/L — ABNORMAL LOW (ref 135–145)

## 2018-05-04 LAB — CBC
HCT: 36.7 % — ABNORMAL LOW (ref 39.0–52.0)
Hemoglobin: 10.9 g/dL — ABNORMAL LOW (ref 13.0–17.0)
MCH: 26.6 pg (ref 26.0–34.0)
MCHC: 29.7 g/dL — ABNORMAL LOW (ref 30.0–36.0)
MCV: 89.5 fL (ref 80.0–100.0)
Platelets: 420 10*3/uL — ABNORMAL HIGH (ref 150–400)
RBC: 4.1 MIL/uL — ABNORMAL LOW (ref 4.22–5.81)
RDW: 16.9 % — ABNORMAL HIGH (ref 11.5–15.5)
WBC: 11.1 10*3/uL — ABNORMAL HIGH (ref 4.0–10.5)
nRBC: 0 % (ref 0.0–0.2)

## 2018-05-04 LAB — PHOSPHORUS: Phosphorus: 4 mg/dL (ref 2.5–4.6)

## 2018-05-04 LAB — MAGNESIUM: MAGNESIUM: 1.9 mg/dL (ref 1.7–2.4)

## 2018-05-05 DIAGNOSIS — J9621 Acute and chronic respiratory failure with hypoxia: Secondary | ICD-10-CM | POA: Diagnosis not present

## 2018-05-05 DIAGNOSIS — J449 Chronic obstructive pulmonary disease, unspecified: Secondary | ICD-10-CM | POA: Diagnosis not present

## 2018-05-05 DIAGNOSIS — G4733 Obstructive sleep apnea (adult) (pediatric): Secondary | ICD-10-CM | POA: Diagnosis not present

## 2018-05-05 DIAGNOSIS — I5032 Chronic diastolic (congestive) heart failure: Secondary | ICD-10-CM | POA: Diagnosis not present

## 2018-05-05 NOTE — Progress Notes (Addendum)
Pulmonary Critical Care Medicine [facility] [place]   PULMONARY CRITICAL CARE SERVICE  PROGRESS NOTE  Date of Service: 05/05/2018  Wesley Rios  SPQ:[id number]  DOB: 04/12/1945   DOA: 04/24/2018  Referring Physician: Carron Curie, MD  HPI: Wesley Rios is a 73 y.o. male seen for follow up of Acute on Chronic Respiratory Failure.  Patient remains on Oxymizer currently on 5 L and doing well at this time.  Medications: Reviewed on Rounds  Physical Exam:  Vitals: Pulse 68 respirations 18 BP 124/66 O2 sat 97% temp 98.0  Ventilator Settings patient's not on ventilator  . General: Comfortable at this time . Eyes: Grossly normal lids, irises & conjunctiva . ENT: grossly tongue is normal . Neck: no obvious mass . Cardiovascular: S1 S2 normal no gallop . Respiratory: No rales or rhonchi noted . Abdomen: soft . Skin: no rash seen on limited exam . Musculoskeletal: not rigid . Psychiatric:unable to assess . Neurologic: no seizure no involuntary movements         Lab Data:   Basic Metabolic Panel: Recent Labs  Lab 05/04/18 0458  NA 134*  K 3.6  CL 92*  CO2 32  GLUCOSE 138*  BUN 15  CREATININE 0.80  CALCIUM 9.1  MG 1.9  PHOS 4.0    ABG: No results for input(s): PHART, PCO2ART, PO2ART, HCO3, O2SAT in the last 168 hours.  Liver Function Tests: No results for input(s): AST, ALT, ALKPHOS, BILITOT, PROT, ALBUMIN in the last 168 hours. No results for input(s): LIPASE, AMYLASE in the last 168 hours. No results for input(s): AMMONIA in the last 168 hours.  CBC: Recent Labs  Lab 05/04/18 0458  WBC 11.1*  HGB 10.9*  HCT 36.7*  MCV 89.5  PLT 420*    Cardiac Enzymes: No results for input(s): CKTOTAL, CKMB, CKMBINDEX, TROPONINI in the last 168 hours.  BNP (last 3 results) No results for input(s): BNP in the last 8760 hours.  ProBNP (last 3 results) No results for input(s): PROBNP in the last 8760 hours.  Radiological Exams: No results  found.  Assessment/Plan Active Problems:   Acute on chronic respiratory failure with hypoxia (HCC)   COPD (chronic obstructive pulmonary disease) (HCC)   Other secondary pulmonary hypertension (HCC)   Chronic diastolic heart failure (HCC)   Obstructive sleep apnea   1. Acute on chronic respiratory failure with hypoxia continue with Oxymizer and secretion management and pulmonary toilet at this time. 2. COPD severe disease we will continue with present therapy. 3. Secondary pulmonary hypertension at baseline continue oxygen therapy 4. Chronic diastolic heart failure diuretics as tolerated 5. Sleep apnea encourage positive airway pressure   I have personally seen and evaluated the patient, evaluated laboratory and imaging results, formulated the assessment and plan and placed orders. The Patient requires high complexity decision making for assessment and support.  Case was discussed on Rounds with the Respiratory Therapy Staff  Yevonne Pax, MD [facility] Pulmonary Critical Care Medicine Sleep Medicine

## 2018-05-06 DIAGNOSIS — J9621 Acute and chronic respiratory failure with hypoxia: Secondary | ICD-10-CM | POA: Diagnosis not present

## 2018-05-06 DIAGNOSIS — J449 Chronic obstructive pulmonary disease, unspecified: Secondary | ICD-10-CM | POA: Diagnosis not present

## 2018-05-06 DIAGNOSIS — I5032 Chronic diastolic (congestive) heart failure: Secondary | ICD-10-CM | POA: Diagnosis not present

## 2018-05-06 DIAGNOSIS — G4733 Obstructive sleep apnea (adult) (pediatric): Secondary | ICD-10-CM | POA: Diagnosis not present

## 2018-05-06 NOTE — Progress Notes (Addendum)
Pulmonary Critical Care Medicine [name]   PULMONARY CRITICAL CARE SERVICE  PROGRESS NOTE  Date of Service: 05/06/2018  Wesley Rios  OLI:[id number]  DOB: 04-03-1945   DOA: 04/24/2018  Referring Physician: Carron Curie, MD  HPI: Wesley Rios is a 73 y.o. male seen for follow up of Acute on Chronic Respiratory Failure.  Patient was switched to 5 L of oxygen via nasal cannula this morning.  It is of note the patient uses 4 L of oxygen at home.  He has minimal secretions and no acute distress noted at this time.  Medications: Reviewed on Rounds  Physical Exam:  Vitals: Pulse 68 respirations 14 BP 119/66 O2 sat 92% temp 97.5  Ventilator Settings not currently on ventilator  . General: Comfortable at this time . Eyes: Grossly normal lids, irises & conjunctiva . ENT: grossly tongue is normal . Neck: no obvious mass . Cardiovascular: S1 S2 normal no gallop . Respiratory: No rales or rhonchi noted . Abdomen: soft . Skin: no rash seen on limited exam . Musculoskeletal: not rigid . Psychiatric:unable to assess . Neurologic: no seizure no involuntary movements         Lab Data:   Basic Metabolic Panel: Recent Labs  Lab 05/04/18 0458  NA 134*  K 3.6  CL 92*  CO2 32  GLUCOSE 138*  BUN 15  CREATININE 0.80  CALCIUM 9.1  MG 1.9  PHOS 4.0    ABG: No results for input(s): PHART, PCO2ART, PO2ART, HCO3, O2SAT in the last 168 hours.  Liver Function Tests: No results for input(s): AST, ALT, ALKPHOS, BILITOT, PROT, ALBUMIN in the last 168 hours. No results for input(s): LIPASE, AMYLASE in the last 168 hours. No results for input(s): AMMONIA in the last 168 hours.  CBC: Recent Labs  Lab 05/04/18 0458  WBC 11.1*  HGB 10.9*  HCT 36.7*  MCV 89.5  PLT 420*    Cardiac Enzymes: No results for input(s): CKTOTAL, CKMB, CKMBINDEX, TROPONINI in the last 168 hours.  BNP (last 3 results) No results for input(s): BNP in the last 8760 hours.  ProBNP  (last 3 results) No results for input(s): PROBNP in the last 8760 hours.  Radiological Exams: No results found.  Assessment/Plan Active Problems:   Acute on chronic respiratory failure with hypoxia (HCC)   COPD (chronic obstructive pulmonary disease) (HCC)   Other secondary pulmonary hypertension (HCC)   Chronic diastolic heart failure (HCC)   Obstructive sleep apnea   1. Acute on chronic respiratory failure with hypoxia continue with oxygen as needed and secretion management pulmonary toilet. 2. COPD severe disease we will continue with present therapy 3. Secondary pulmonary hypertension at baseline continue oxygen therapy 4. Chronic diastolic heart failure diuretics as tolerated 5. Sleep apnea encourage positive airway pressure   I have personally seen and evaluated the patient, evaluated laboratory and imaging results, formulated the assessment and plan and placed orders. The Patient requires high complexity decision making for assessment and support.  Case was discussed on Rounds with the Respiratory Therapy Staff  Yevonne Pax, MD [facility] Pulmonary Critical Care Medicine Sleep Medicine

## 2018-05-07 DIAGNOSIS — J9621 Acute and chronic respiratory failure with hypoxia: Secondary | ICD-10-CM | POA: Diagnosis not present

## 2018-05-07 DIAGNOSIS — G4733 Obstructive sleep apnea (adult) (pediatric): Secondary | ICD-10-CM | POA: Diagnosis not present

## 2018-05-07 DIAGNOSIS — I5032 Chronic diastolic (congestive) heart failure: Secondary | ICD-10-CM | POA: Diagnosis not present

## 2018-05-07 DIAGNOSIS — J449 Chronic obstructive pulmonary disease, unspecified: Secondary | ICD-10-CM | POA: Diagnosis not present

## 2018-05-07 LAB — BASIC METABOLIC PANEL
Anion gap: 11 (ref 5–15)
BUN: 16 mg/dL (ref 8–23)
CALCIUM: 9.2 mg/dL (ref 8.9–10.3)
CHLORIDE: 91 mmol/L — AB (ref 98–111)
CO2: 34 mmol/L — AB (ref 22–32)
Creatinine, Ser: 0.75 mg/dL (ref 0.61–1.24)
GFR calc Af Amer: >60 mL/min (ref >60)
GFR calc non Af Amer: >60 mL/min (ref >60)
Glucose, Bld: 153 mg/dL — ABNORMAL HIGH (ref 70–99)
Potassium: 3.8 mmol/L (ref 3.5–5.1)
Sodium: 136 mmol/L (ref 135–145)

## 2018-05-07 NOTE — Progress Notes (Addendum)
Pulmonary Critical Care Medicine [facility] [place]   PULMONARY CRITICAL CARE SERVICE  PROGRESS NOTE  Date of Service: 05/07/2018  Wesley Rios  [id number]:[id number]  DOB: 23-Jan-1946   DOA: 04/24/2018  Referring Physician: Carron Curie, MD  HPI: Wesley Rios is a 73 y.o. male seen for follow up of Acute on Chronic Respiratory Failure.  Patient continues on 5 L of oxygen via nasal cannula.  Doing well at this time with no acute distress noted.  Medications: Reviewed on Rounds  Physical Exam:  Vitals: Pulse 74 respirations 16 BP 114/70 O2 sat 92% temp 96.8  Ventilator Settings not currently on ventilator  . General: Comfortable at this time . Eyes: Grossly normal lids, irises & conjunctiva . ENT: grossly tongue is normal . Neck: no obvious mass . Cardiovascular: S1 S2 normal no gallop . Respiratory: No rales or rhonchi noted . Abdomen: soft . Skin: no rash seen on limited exam . Musculoskeletal: not rigid . Psychiatric:unable to assess . Neurologic: no seizure no involuntary movements         Lab Data:   Basic Metabolic Panel: Recent Labs  Lab 05/04/18 0458 05/07/18 0424  NA 134* 136  K 3.6 3.8  CL 92* 91*  CO2 32 34*  GLUCOSE 138* 153*  BUN 15 16  CREATININE 0.80 0.75  CALCIUM 9.1 9.2  MG 1.9  --   PHOS 4.0  --     ABG: No results for input(s): PHART, PCO2ART, PO2ART, HCO3, O2SAT in the last 168 hours.  Liver Function Tests: No results for input(s): AST, ALT, ALKPHOS, BILITOT, PROT, ALBUMIN in the last 168 hours. No results for input(s): LIPASE, AMYLASE in the last 168 hours. No results for input(s): AMMONIA in the last 168 hours.  CBC: Recent Labs  Lab 05/04/18 0458  WBC 11.1*  HGB 10.9*  HCT 36.7*  MCV 89.5  PLT 420*    Cardiac Enzymes: No results for input(s): CKTOTAL, CKMB, CKMBINDEX, TROPONINI in the last 168 hours.  BNP (last 3 results) No results for input(s): BNP in the last 8760 hours.  ProBNP (last 3 results) No  results for input(s): PROBNP in the last 8760 hours.  Radiological Exams: No results found.  Assessment/Plan Active Problems:   Acute on chronic respiratory failure with hypoxia (HCC)   COPD (chronic obstructive pulmonary disease) (HCC)   Other secondary pulmonary hypertension (HCC)   Chronic diastolic heart failure (HCC)   Obstructive sleep apnea   1. Acute on chronic respiratory failure with hypoxia continue with oxygen as needed and secretion management as well as pulmonary toilet. 2. COPD severe disease continue as an therapy 3. Secondary pulmonary hypertension at baseline continue oxygen therapy 4. Chronic diastolic heart failure diuretics as tolerated 5. Sleep apnea bipap discontinued, pt doing well at this time.   I have personally seen and evaluated the patient, evaluated laboratory and imaging results, formulated the assessment and plan and placed orders. The Patient requires high complexity decision making for assessment and support.  Case was discussed on Rounds with the Respiratory Therapy Staff  Yevonne Pax, MD [facility] Pulmonary Critical Care Medicine Sleep Medicine

## 2018-05-08 DIAGNOSIS — J449 Chronic obstructive pulmonary disease, unspecified: Secondary | ICD-10-CM | POA: Diagnosis not present

## 2018-05-08 DIAGNOSIS — G4733 Obstructive sleep apnea (adult) (pediatric): Secondary | ICD-10-CM | POA: Diagnosis not present

## 2018-05-08 DIAGNOSIS — I5032 Chronic diastolic (congestive) heart failure: Secondary | ICD-10-CM | POA: Diagnosis not present

## 2018-05-08 DIAGNOSIS — J9621 Acute and chronic respiratory failure with hypoxia: Secondary | ICD-10-CM | POA: Diagnosis not present

## 2018-05-08 NOTE — Progress Notes (Addendum)
Pulmonary Critical Care Medicine [facility] [place]   PULMONARY CRITICAL CARE SERVICE  PROGRESS NOTE  Date of Service: 05/08/2018  Wesley Rios  WLN:[id number]  DOB: Jan 12, 1946   DOA: 04/24/2018  Referring Physician: Carron Curie, MD  HPI: Wesley Rios is a 73 y.o. male seen for follow up of Acute on Chronic Respiratory Failure.  Patient remains on 5 L of oxygen via nasal cannula.  He continues to get up from bed to chair without difficulty.  No distress noted at this time.  Medications: Reviewed on Rounds  Physical Exam:  Vitals: Pulse 61 respirations 18 BP 156/61 O2 sat he 5% temp 97.0  Ventilator Settings not currently on later  . General: Comfortable at this time . Eyes: Grossly normal lids, irises & conjunctiva . ENT: grossly tongue is normal . Neck: no obvious mass . Cardiovascular: S1 S2 normal no gallop . Respiratory: No rales or rhonchi noted . Abdomen: soft . Skin: no rash seen on limited exam . Musculoskeletal: not rigid . Psychiatric:unable to assess . Neurologic: no seizure no involuntary movements         Lab Data:   Basic Metabolic Panel: Recent Labs  Lab 05/04/18 0458 05/07/18 0424  NA 134* 136  K 3.6 3.8  CL 92* 91*  CO2 32 34*  GLUCOSE 138* 153*  BUN 15 16  CREATININE 0.80 0.75  CALCIUM 9.1 9.2  MG 1.9  --   PHOS 4.0  --     ABG: No results for input(s): PHART, PCO2ART, PO2ART, HCO3, O2SAT in the last 168 hours.  Liver Function Tests: No results for input(s): AST, ALT, ALKPHOS, BILITOT, PROT, ALBUMIN in the last 168 hours. No results for input(s): LIPASE, AMYLASE in the last 168 hours. No results for input(s): AMMONIA in the last 168 hours.  CBC: Recent Labs  Lab 05/04/18 0458  WBC 11.1*  HGB 10.9*  HCT 36.7*  MCV 89.5  PLT 420*    Cardiac Enzymes: No results for input(s): CKTOTAL, CKMB, CKMBINDEX, TROPONINI in the last 168 hours.  BNP (last 3 results) No results for input(s): BNP in the last 8760  hours.  ProBNP (last 3 results) No results for input(s): PROBNP in the last 8760 hours.  Radiological Exams: No results found.  Assessment/Plan Active Problems:   Acute on chronic respiratory failure with hypoxia (HCC)   COPD (chronic obstructive pulmonary disease) (HCC)   Other secondary pulmonary hypertension (HCC)   Chronic diastolic heart failure (HCC)   Obstructive sleep apnea   1. Acute on chronic respiratory failure with hypoxia continue with oxygen as needed and secretion management as well as pulmonary toilet and supportive measures. 2. COPD severe disease continue present therapy 3. Secondary pulmonary hypertension at baseline continue oxygen therapy 4. Chronic diastolic heart failure diuretics as tolerated 5. Sleep apnea BiPAP discontinued patient well at this time   I have personally seen and evaluated the patient, evaluated laboratory and imaging results, formulated the assessment and plan and placed orders. The Patient requires high complexity decision making for assessment and support.  Case was discussed on Rounds with the Respiratory Therapy Staff  Yevonne Pax, MD [facility] Pulmonary Critical Care Medicine Sleep Medicine

## 2018-05-09 DIAGNOSIS — I5032 Chronic diastolic (congestive) heart failure: Secondary | ICD-10-CM | POA: Diagnosis not present

## 2018-05-09 DIAGNOSIS — J449 Chronic obstructive pulmonary disease, unspecified: Secondary | ICD-10-CM | POA: Diagnosis not present

## 2018-05-09 DIAGNOSIS — J9621 Acute and chronic respiratory failure with hypoxia: Secondary | ICD-10-CM | POA: Diagnosis not present

## 2018-05-09 DIAGNOSIS — G4733 Obstructive sleep apnea (adult) (pediatric): Secondary | ICD-10-CM | POA: Diagnosis not present

## 2018-05-09 LAB — BASIC METABOLIC PANEL
Anion gap: 7 (ref 5–15)
BUN: 15 mg/dL (ref 8–23)
CO2: 33 mmol/L — ABNORMAL HIGH (ref 22–32)
Calcium: 8.9 mg/dL (ref 8.9–10.3)
Chloride: 95 mmol/L — ABNORMAL LOW (ref 98–111)
Creatinine, Ser: 0.7 mg/dL (ref 0.61–1.24)
GFR calc Af Amer: >60 mL/min (ref >60)
GFR calc non Af Amer: >60 mL/min (ref >60)
Glucose, Bld: 142 mg/dL — ABNORMAL HIGH (ref 70–99)
POTASSIUM: 4 mmol/L (ref 3.5–5.1)
Sodium: 135 mmol/L (ref 135–145)

## 2018-05-09 LAB — CBC
HCT: 37.5 % — ABNORMAL LOW (ref 39.0–52.0)
Hemoglobin: 11.4 g/dL — ABNORMAL LOW (ref 13.0–17.0)
MCH: 27 pg (ref 26.0–34.0)
MCHC: 30.4 g/dL (ref 30.0–36.0)
MCV: 88.9 fL (ref 80.0–100.0)
Platelets: 372 10*3/uL (ref 150–400)
RBC: 4.22 MIL/uL (ref 4.22–5.81)
RDW: 17 % — ABNORMAL HIGH (ref 11.5–15.5)
WBC: 9.4 10*3/uL (ref 4.0–10.5)
nRBC: 0 % (ref 0.0–0.2)

## 2018-05-09 NOTE — Progress Notes (Addendum)
Pulmonary Critical Care Medicine [facility] [place]   PULMONARY CRITICAL CARE SERVICE  PROGRESS NOTE  Date of Service: 05/09/2018  Wesley Rios  [id number]  DOB: [birth year]   DOA: 04/24/2018  Referring Physician: Carron Curie, MD  HPI: Wesley Rios is a 73 y.o. male seen for follow up of Acute on Chronic Respiratory Failure.  Patient remains on 5 L of oxygen via nasal cannula at this time.  Staff report that he is noncompliant with keeping his pulse ox on.  He has no acute distress noted at this time.  Medications: Reviewed on Rounds  Physical Exam:  Vitals: Pulse 63 respirations 20 BP 122/54 O2 sat 97% and a 7.3  Ventilator Settings not currently on ventilator  . General: Comfortable at this time . Eyes: Grossly normal lids, irises & conjunctiva . ENT: grossly tongue is normal . Neck: no obvious mass . Cardiovascular: S1 S2 normal no gallop . Respiratory: No rales or rhonchi noted . Abdomen: soft . Skin: no rash seen on limited exam . Musculoskeletal: not rigid . Psychiatric:unable to assess . Neurologic: no seizure no involuntary movements         Lab Data:   Basic Metabolic Panel: Recent Labs  Lab 05/04/18 0458 05/07/18 0424 05/09/18 0425  NA 134* 136 135  K 3.6 3.8 4.0  CL 92* 91* 95*  CO2 32 34* 33*  GLUCOSE 138* 153* 142*  BUN 15 16 15   CREATININE 0.80 0.75 0.70  CALCIUM 9.1 9.2 8.9  MG 1.9  --   --   PHOS 4.0  --   --     ABG: No results for input(s): PHART, PCO2ART, PO2ART, HCO3, O2SAT in the last 168 hours.  Liver Function Tests: No results for input(s): AST, ALT, ALKPHOS, BILITOT, PROT, ALBUMIN in the last 168 hours. No results for input(s): LIPASE, AMYLASE in the last 168 hours. No results for input(s): AMMONIA in the last 168 hours.  CBC: Recent Labs  Lab 05/04/18 0458 05/09/18 0425  WBC 11.1* 9.4  HGB 10.9* 11.4*  HCT 36.7* 37.5*  MCV 89.5 88.9  PLT 420* 372    Cardiac Enzymes: No results for input(s):  CKTOTAL, CKMB, CKMBINDEX, TROPONINI in the last 168 hours.  BNP (last 3 results) No results for input(s): BNP in the last 8760 hours.  ProBNP (last 3 results) No results for input(s): PROBNP in the last 8760 hours.  Radiological Exams: No results found.  Assessment/Plan Active Problems:   Acute on chronic respiratory failure with hypoxia (HCC)   COPD (chronic obstructive pulmonary disease) (HCC)   Other secondary pulmonary hypertension (HCC)   Chronic diastolic heart failure (HCC)   Obstructive sleep apnea   1. Acute on chronic respiratory failure with hypoxia continue with oxygen as needed and secretion management as well as pulmonary toilet. 2. COPD severe disease continue present therapy 3. Secondary pulmonary hypertension at baseline continue oxygen therapy 4. Chronic diastolic heart failure diuretics as tolerated 5. Sleep apnea.  Nonissue at this time.   I have personally seen and evaluated the patient, evaluated laboratory and imaging results, formulated the assessment and plan and placed orders. The Patient requires high complexity decision making for assessment and support.  Case was discussed on Rounds with the Respiratory Therapy Staff  Yevonne Pax, MD [facility] Pulmonary Critical Care Medicine Sleep Medicine

## 2018-05-10 DIAGNOSIS — J9621 Acute and chronic respiratory failure with hypoxia: Secondary | ICD-10-CM | POA: Diagnosis not present

## 2018-05-10 DIAGNOSIS — J449 Chronic obstructive pulmonary disease, unspecified: Secondary | ICD-10-CM | POA: Diagnosis not present

## 2018-05-10 DIAGNOSIS — G4733 Obstructive sleep apnea (adult) (pediatric): Secondary | ICD-10-CM | POA: Diagnosis not present

## 2018-05-10 DIAGNOSIS — I5032 Chronic diastolic (congestive) heart failure: Secondary | ICD-10-CM | POA: Diagnosis not present

## 2018-05-10 NOTE — Progress Notes (Addendum)
Pulmonary Critical Care Medicine [facility] [place]   PULMONARY CRITICAL CARE SERVICE  PROGRESS NOTE  Date of Service: 05/10/2018  Wesley Rios  [id number]:[id number]  DOB: 1945/12/06   DOA: 04/24/2018  Referring Physician: Carron Curie, MD  HPI: Wesley Rios is a 73 y.o. male seen for follow up of Acute on Chronic Respiratory Failure.  Patient remains on 5 L of oxygen via nasal cannula.  No acute distress is noted at this time.  Medications: Reviewed on Rounds  Physical Exam:  Vitals: Pulse 72 respirations 18 BP 111/66 O2 sat 95% temp 98.0  Ventilator Settings not currently on ventilator  . General: Comfortable at this time . Eyes: Grossly normal lids, irises & conjunctiva . ENT: grossly tongue is normal . Neck: no obvious mass . Cardiovascular: S1 S2 normal no gallop . Respiratory: No rales or rhonchi noted . Abdomen: soft . Skin: no rash seen on limited exam . Musculoskeletal: not rigid . Psychiatric:unable to assess . Neurologic: no seizure no involuntary movements         Lab Data:   Basic Metabolic Panel: Recent Labs  Lab 05/04/18 0458 05/07/18 0424 05/09/18 0425  NA 134* 136 135  K 3.6 3.8 4.0  CL 92* 91* 95*  CO2 32 34* 33*  GLUCOSE 138* 153* 142*  BUN 15 16 15   CREATININE 0.80 0.75 0.70  CALCIUM 9.1 9.2 8.9  MG 1.9  --   --   PHOS 4.0  --   --     ABG: No results for input(s): PHART, PCO2ART, PO2ART, HCO3, O2SAT in the last 168 hours.  Liver Function Tests: No results for input(s): AST, ALT, ALKPHOS, BILITOT, PROT, ALBUMIN in the last 168 hours. No results for input(s): LIPASE, AMYLASE in the last 168 hours. No results for input(s): AMMONIA in the last 168 hours.  CBC: Recent Labs  Lab 05/04/18 0458 05/09/18 0425  WBC 11.1* 9.4  HGB 10.9* 11.4*  HCT 36.7* 37.5*  MCV 89.5 88.9  PLT 420* 372    Cardiac Enzymes: No results for input(s): CKTOTAL, CKMB, CKMBINDEX, TROPONINI in the last 168 hours.  BNP (last 3 results) No  results for input(s): BNP in the last 8760 hours.  ProBNP (last 3 results) No results for input(s): PROBNP in the last 8760 hours.  Radiological Exams: No results found.  Assessment/Plan Active Problems:   Acute on chronic respiratory failure with hypoxia (HCC)   COPD (chronic obstructive pulmonary disease) (HCC)   Other secondary pulmonary hypertension (HCC)   Chronic diastolic heart failure (HCC)   Obstructive sleep apnea   1. Acute on chronic respiratory failure with hypoxia continue with oxygen as needed and secretion management as well as pulmonary toilet. 2. COPD severe disease continue present management 3. Secondary pulmonary hypertension at baseline continue oxygen therapy 4. Chronic diastolic heart failure diuretics as tolerated 5. Sleep apnea nonissue at this time   I have personally seen and evaluated the patient, evaluated laboratory and imaging results, formulated the assessment and plan and placed orders. The Patient requires high complexity decision making for assessment and support.  Case was discussed on Rounds with the Respiratory Therapy Staff  Yevonne Pax, MD [facility] Pulmonary Critical Care Medicine Sleep Medicine

## 2018-05-11 DIAGNOSIS — G4733 Obstructive sleep apnea (adult) (pediatric): Secondary | ICD-10-CM | POA: Diagnosis not present

## 2018-05-11 DIAGNOSIS — J9621 Acute and chronic respiratory failure with hypoxia: Secondary | ICD-10-CM | POA: Diagnosis not present

## 2018-05-11 DIAGNOSIS — I5032 Chronic diastolic (congestive) heart failure: Secondary | ICD-10-CM | POA: Diagnosis not present

## 2018-05-11 DIAGNOSIS — J449 Chronic obstructive pulmonary disease, unspecified: Secondary | ICD-10-CM | POA: Diagnosis not present

## 2018-05-11 NOTE — Progress Notes (Addendum)
Pulmonary Critical Care Medicine [facility] [place]   PULMONARY CRITICAL CARE SERVICE  PROGRESS NOTE  Date of Service: 05/11/2018  Wesley Rios  CEQ:[id number]  DOB: [birth year]   DOA: 04/24/2018  Referring Physician: Carron Curie, MD  HPI: Wesley Rios is a 73 y.o. male seen for follow up of Acute on Chronic Respiratory Failure.  Patient remains on 4 L of oxygen via nasal cannula.  No acute distress noted at this time.  Medications: Reviewed on Rounds  Physical Exam:  Vitals: Pulse 61 respirations 20 BP 141/75 O2 sat 92% temp 97.0  Ventilator Settings not currently on ventilator  . General: Comfortable at this time . Eyes: Grossly normal lids, irises & conjunctiva . ENT: grossly tongue is normal . Neck: no obvious mass . Cardiovascular: S1 S2 normal no gallop . Respiratory: No rales or rhonchi noted . Abdomen: soft . Skin: no rash seen on limited exam . Musculoskeletal: not rigid . Psychiatric:unable to assess . Neurologic: no seizure no involuntary movements         Lab Data:   Basic Metabolic Panel: Recent Labs  Lab 05/07/18 0424 05/09/18 0425  NA 136 135  K 3.8 4.0  CL 91* 95*  CO2 34* 33*  GLUCOSE 153* 142*  BUN 16 15  CREATININE 0.75 0.70  CALCIUM 9.2 8.9    ABG: No results for input(s): PHART, PCO2ART, PO2ART, HCO3, O2SAT in the last 168 hours.  Liver Function Tests: No results for input(s): AST, ALT, ALKPHOS, BILITOT, PROT, ALBUMIN in the last 168 hours. No results for input(s): LIPASE, AMYLASE in the last 168 hours. No results for input(s): AMMONIA in the last 168 hours.  CBC: Recent Labs  Lab 05/09/18 0425  WBC 9.4  HGB 11.4*  HCT 37.5*  MCV 88.9  PLT 372    Cardiac Enzymes: No results for input(s): CKTOTAL, CKMB, CKMBINDEX, TROPONINI in the last 168 hours.  BNP (last 3 results) No results for input(s): BNP in the last 8760 hours.  ProBNP (last 3 results) No results for input(s): PROBNP in the last 8760  hours.  Radiological Exams: No results found.  Assessment/Plan Active Problems:   Acute on chronic respiratory failure with hypoxia (HCC)   COPD (chronic obstructive pulmonary disease) (HCC)   Other secondary pulmonary hypertension (HCC)   Chronic diastolic heart failure (HCC)   Obstructive sleep apnea   1. Acute on chronic respiratory failure with hypoxia continue with oxygen as needed and secretion management as well as pulmonary toilet. 2. COPD severe disease continue present management 3. Secondary pulmonary hypertension at baseline continue oxygen therapy 4. Chronic diastolic heart failure diuretics as tolerated 5. Sleep apnea nonissue at this time   I have personally seen and evaluated the patient, evaluated laboratory and imaging results, formulated the assessment and plan and placed orders. The Patient requires high complexity decision making for assessment and support.  Case was discussed on Rounds with the Respiratory Therapy Staff  Yevonne Pax, MD [facility] Pulmonary Critical Care Medicine Sleep Medicine

## 2018-05-12 ENCOUNTER — Other Ambulatory Visit (HOSPITAL_COMMUNITY): Payer: Self-pay

## 2018-05-12 DIAGNOSIS — J9621 Acute and chronic respiratory failure with hypoxia: Secondary | ICD-10-CM | POA: Diagnosis not present

## 2018-05-12 DIAGNOSIS — G4733 Obstructive sleep apnea (adult) (pediatric): Secondary | ICD-10-CM | POA: Diagnosis not present

## 2018-05-12 DIAGNOSIS — I5032 Chronic diastolic (congestive) heart failure: Secondary | ICD-10-CM | POA: Diagnosis not present

## 2018-05-12 DIAGNOSIS — J449 Chronic obstructive pulmonary disease, unspecified: Secondary | ICD-10-CM | POA: Diagnosis not present

## 2018-05-12 MED ORDER — GENERIC EXTERNAL MEDICATION
Status: DC
Start: ? — End: 2018-05-12

## 2018-05-12 NOTE — Progress Notes (Addendum)
Pulmonary Critical Care Medicine [facility] [place]   PULMONARY CRITICAL CARE SERVICE  PROGRESS NOTE  Date of Service: 05/12/2018  Wesley Rios  [id number]  DOB: [birth year]   DOA: 04/24/2018  Referring Physician: Carron Curie, MD  HPI: Wesley Rios is a 73 y.o. male seen for follow up of Acute on Chronic Respiratory Failure.  Patient's oxygen requirement increased overnight and he is now on 8 L of oxygen via the Oxymizer.  He had some shortness of breath on his 5 L via nasal cannula and was found to have a O2 saturation of 83%.  Currently doing well on the Oxymizer.  Medications: Reviewed on Rounds  Physical Exam:  Vitals: Pulse 80 respirations 20 BP 152/64 O2 sat 93% temp 98.5  Ventilator Settings not currently on ventilator.  Currently on 8 L of IV Oxymizer.  . General: Comfortable at this time . Eyes: Grossly normal lids, irises & conjunctiva . ENT: grossly tongue is normal . Neck: no obvious mass . Cardiovascular: S1 S2 normal no gallop . Respiratory: No rales or rhonchi noted . Abdomen: soft . Skin: no rash seen on limited exam . Musculoskeletal: not rigid . Psychiatric:unable to assess . Neurologic: no seizure no involuntary movements         Lab Data:   Basic Metabolic Panel: Recent Labs  Lab 05/07/18 0424 05/09/18 0425  NA 136 135  K 3.8 4.0  CL 91* 95*  CO2 34* 33*  GLUCOSE 153* 142*  BUN 16 15  CREATININE 0.75 0.70  CALCIUM 9.2 8.9    ABG: No results for input(s): PHART, PCO2ART, PO2ART, HCO3, O2SAT in the last 168 hours.  Liver Function Tests: No results for input(s): AST, ALT, ALKPHOS, BILITOT, PROT, ALBUMIN in the last 168 hours. No results for input(s): LIPASE, AMYLASE in the last 168 hours. No results for input(s): AMMONIA in the last 168 hours.  CBC: Recent Labs  Lab 05/09/18 0425  WBC 9.4  HGB 11.4*  HCT 37.5*  MCV 88.9  PLT 372    Cardiac Enzymes: No results for input(s): CKTOTAL, CKMB, CKMBINDEX, TROPONINI  in the last 168 hours.  BNP (last 3 results) No results for input(s): BNP in the last 8760 hours.  ProBNP (last 3 results) No results for input(s): PROBNP in the last 8760 hours.  Radiological Exams: Dg Chest Port 1 View  Result Date: 05/12/2018 CLINICAL DATA:  Aspiration EXAM: PORTABLE CHEST 1 VIEW COMPARISON:  04/30/2018 chest radiograph. FINDINGS: Surgical hardware from ACDF overlies the lower cervical spine. Stable cardiomediastinal silhouette with mild cardiomegaly. No pneumothorax. No pleural effusion. No overt pulmonary edema. Patchy and linear bibasilar lung opacities are not substantially changed. IMPRESSION: 1. Stable mild cardiomegaly without overt pulmonary edema. 2. No substantial change in patchy and linear bibasilar lung opacities, which could represent atelectasis, aspiration and/or pneumonia. Continued chest radiograph follow-up advised. Electronically Signed   By: Delbert Phenix M.D.   On: 05/12/2018 12:32    Assessment/Plan Active Problems:   Acute on chronic respiratory failure with hypoxia (HCC)   COPD (chronic obstructive pulmonary disease) (HCC)   Other secondary pulmonary hypertension (HCC)   Chronic diastolic heart failure (HCC)   Obstructive sleep apnea   1. Acute on chronic respiratory failure with hypoxia continue with oxygen as needed and secretion management and pulmonary toilet. 2. COPD severe disease continue present management next secondary pulmonary hypertension at baseline continue oxygen therapy 3. Chronic diastolic heart failure diuretics as tolerated 4. Sleep apnea nonissue at this time   I  have personally seen and evaluated the patient, evaluated laboratory and imaging results, formulated the assessment and plan and placed orders. The Patient requires high complexity decision making for assessment and support.  Case was discussed on Rounds with the Respiratory Therapy Staff  Yevonne Pax, MD [facility] Pulmonary Critical Care Medicine Sleep  Medicine

## 2018-05-13 DIAGNOSIS — I5032 Chronic diastolic (congestive) heart failure: Secondary | ICD-10-CM | POA: Diagnosis not present

## 2018-05-13 DIAGNOSIS — J449 Chronic obstructive pulmonary disease, unspecified: Secondary | ICD-10-CM | POA: Diagnosis not present

## 2018-05-13 DIAGNOSIS — G4733 Obstructive sleep apnea (adult) (pediatric): Secondary | ICD-10-CM | POA: Diagnosis not present

## 2018-05-13 DIAGNOSIS — J9621 Acute and chronic respiratory failure with hypoxia: Secondary | ICD-10-CM | POA: Diagnosis not present

## 2018-05-13 NOTE — Progress Notes (Addendum)
Pulmonary Critical Care Medicine [facility] [place]   PULMONARY CRITICAL CARE SERVICE  PROGRESS NOTE  Date of Service: 05/13/2018  Wesley Rios  [id number]  DOB: 1945/05/21   DOA: 04/24/2018  Referring Physician: Carron Curie, MD  HPI: Wesley Rios is a 73 y.o. male seen for follow up of Acute on Chronic Respiratory Failure.  Patient remains on Oxymizer at 6 L today.  He is satting well at this time with no distress noted.  Medications: Reviewed on Rounds  Physical Exam:  Vitals: Pulse 76 respirations 20 BP 131/82 O2 sat 97% temp 97.0  Ventilator Settings not currently on ventilator  . General: Comfortable at this time . Eyes: Grossly normal lids, irises & conjunctiva . ENT: grossly tongue is normal . Neck: no obvious mass . Cardiovascular: S1 S2 normal no gallop . Respiratory: No rales or rhonchi noted . Abdomen: soft . Skin: no rash seen on limited exam . Musculoskeletal: not rigid . Psychiatric:unable to assess . Neurologic: no seizure no involuntary movements         Lab Data:   Basic Metabolic Panel: Recent Labs  Lab 05/07/18 0424 05/09/18 0425  NA 136 135  K 3.8 4.0  CL 91* 95*  CO2 34* 33*  GLUCOSE 153* 142*  BUN 16 15  CREATININE 0.75 0.70  CALCIUM 9.2 8.9    ABG: No results for input(s): PHART, PCO2ART, PO2ART, HCO3, O2SAT in the last 168 hours.  Liver Function Tests: No results for input(s): AST, ALT, ALKPHOS, BILITOT, PROT, ALBUMIN in the last 168 hours. No results for input(s): LIPASE, AMYLASE in the last 168 hours. No results for input(s): AMMONIA in the last 168 hours.  CBC: Recent Labs  Lab 05/09/18 0425  WBC 9.4  HGB 11.4*  HCT 37.5*  MCV 88.9  PLT 372    Cardiac Enzymes: No results for input(s): CKTOTAL, CKMB, CKMBINDEX, TROPONINI in the last 168 hours.  BNP (last 3 results) No results for input(s): BNP in the last 8760 hours.  ProBNP (last 3 results) No results for input(s): PROBNP in the last 8760  hours.  Radiological Exams: Dg Chest Port 1 View  Result Date: 05/12/2018 CLINICAL DATA:  Aspiration EXAM: PORTABLE CHEST 1 VIEW COMPARISON:  04/30/2018 chest radiograph. FINDINGS: Surgical hardware from ACDF overlies the lower cervical spine. Stable cardiomediastinal silhouette with mild cardiomegaly. No pneumothorax. No pleural effusion. No overt pulmonary edema. Patchy and linear bibasilar lung opacities are not substantially changed. IMPRESSION: 1. Stable mild cardiomegaly without overt pulmonary edema. 2. No substantial change in patchy and linear bibasilar lung opacities, which could represent atelectasis, aspiration and/or pneumonia. Continued chest radiograph follow-up advised. Electronically Signed   By: Delbert Phenix M.D.   On: 05/12/2018 12:32    Assessment/Plan Active Problems:   Acute on chronic respiratory failure with hypoxia (HCC)   COPD (chronic obstructive pulmonary disease) (HCC)   Other secondary pulmonary hypertension (HCC)   Chronic diastolic heart failure (HCC)   Obstructive sleep apnea   1. Acute on chronic respiratory failure with hypoxia continue with oxygen via Oxymizer as needed.  Continue patient management and pulmonary toilet. 2. COPD severe disease continue present management 3. Secondary pulmonary hypertension at baseline continue oxygen therapy 4. Chronic diastolic heart failure continue diuretics as tolerated 5. Sleep apnea nonissue   I have personally seen and evaluated the patient, evaluated laboratory and imaging results, formulated the assessment and plan and placed orders. The Patient requires high complexity decision making for assessment and support.  Case was discussed  on Rounds with the Respiratory Therapy Staff  Allyne Gee, MD [facility] Pulmonary Critical Care Medicine Sleep Medicine

## 2018-05-14 DIAGNOSIS — G4733 Obstructive sleep apnea (adult) (pediatric): Secondary | ICD-10-CM | POA: Diagnosis not present

## 2018-05-14 DIAGNOSIS — J9621 Acute and chronic respiratory failure with hypoxia: Secondary | ICD-10-CM | POA: Diagnosis not present

## 2018-05-14 DIAGNOSIS — J449 Chronic obstructive pulmonary disease, unspecified: Secondary | ICD-10-CM | POA: Diagnosis not present

## 2018-05-14 DIAGNOSIS — I5032 Chronic diastolic (congestive) heart failure: Secondary | ICD-10-CM | POA: Diagnosis not present

## 2018-05-14 NOTE — Progress Notes (Signed)
Pulmonary Critical Care Medicine [facility] [place]   PULMONARY CRITICAL CARE SERVICE  PROGRESS NOTE  Date of Service: 05/14/2018  Wesley Rios  [id number]  DOB: 07-Feb-1946   DOA: 04/24/2018  Referring Physician: Carron Curie, MD  HPI: Wesley Rios is a 73 y.o. male seen for follow up of Acute on Chronic Respiratory Failure.  Patient is on 8 L has been on Oxymizer.  Saturations are adequate  Medications: Reviewed on Rounds  Physical Exam:  Vitals: Temperature 97.9 pulse 80 respiratory 20 blood pressure 125/70 saturations 94%  Ventilator Settings on Oxymizer 8 L  . General: Comfortable at this time . Eyes: Grossly normal lids, irises & conjunctiva . ENT: grossly tongue is normal . Neck: no obvious mass . Cardiovascular: S1 S2 normal no gallop . Respiratory: No rhonchi or rales are noted at this time . Abdomen: soft . Skin: no rash seen on limited exam . Musculoskeletal: not rigid . Psychiatric:unable to assess . Neurologic: no seizure no involuntary movements         Lab Data:   Basic Metabolic Panel: Recent Labs  Lab 05/09/18 0425  NA 135  K 4.0  CL 95*  CO2 33*  GLUCOSE 142*  BUN 15  CREATININE 0.70  CALCIUM 8.9    ABG: No results for input(s): PHART, PCO2ART, PO2ART, HCO3, O2SAT in the last 168 hours.  Liver Function Tests: No results for input(s): AST, ALT, ALKPHOS, BILITOT, PROT, ALBUMIN in the last 168 hours. No results for input(s): LIPASE, AMYLASE in the last 168 hours. No results for input(s): AMMONIA in the last 168 hours.  CBC: Recent Labs  Lab 05/09/18 0425  WBC 9.4  HGB 11.4*  HCT 37.5*  MCV 88.9  PLT 372    Cardiac Enzymes: No results for input(s): CKTOTAL, CKMB, CKMBINDEX, TROPONINI in the last 168 hours.  BNP (last 3 results) No results for input(s): BNP in the last 8760 hours.  ProBNP (last 3 results) No results for input(s): PROBNP in the last 8760 hours.  Radiological Exams: No results  found.  Assessment/Plan Active Problems:   Acute on chronic respiratory failure with hypoxia (HCC)   COPD (chronic obstructive pulmonary disease) (HCC)   Other secondary pulmonary hypertension (HCC)   Chronic diastolic heart failure (HCC)   Obstructive sleep apnea   1. Acute on chronic respiratory failure with hypoxia we will continue with Oxymizer titrate oxygen continue pulmonary toilet per 2. COPD severe disease we will continue with supportive care 3. Secondary pulmonary hypertension on oxygen 4. Diastolic heart failure continue with supportive care diuretics as necessary 5. Sleep apnea baseline we will continue present management   I have personally seen and evaluated the patient, evaluated laboratory and imaging results, formulated the assessment and plan and placed orders. The Patient requires high complexity decision making for assessment and support.  Case was discussed on Rounds with the Respiratory Therapy Staff  Yevonne Pax, MD [facility] Pulmonary Critical Care Medicine Sleep Medicine

## 2018-05-15 ENCOUNTER — Other Ambulatory Visit (HOSPITAL_COMMUNITY): Payer: Self-pay

## 2018-05-15 LAB — BASIC METABOLIC PANEL
Anion gap: 12 (ref 5–15)
BUN: 16 mg/dL (ref 8–23)
CO2: 34 mmol/L — ABNORMAL HIGH (ref 22–32)
Calcium: 9.4 mg/dL (ref 8.9–10.3)
Chloride: 91 mmol/L — ABNORMAL LOW (ref 98–111)
Creatinine, Ser: 0.71 mg/dL (ref 0.61–1.24)
GFR calc Af Amer: >60 mL/min (ref >60)
GFR calc non Af Amer: >60 mL/min (ref >60)
Glucose, Bld: 129 mg/dL — ABNORMAL HIGH (ref 70–99)
Potassium: 3.7 mmol/L (ref 3.5–5.1)
Sodium: 137 mmol/L (ref 135–145)

## 2018-05-15 LAB — CBC
HCT: 41.1 % (ref 39.0–52.0)
Hemoglobin: 12 g/dL — ABNORMAL LOW (ref 13.0–17.0)
MCH: 26.3 pg (ref 26.0–34.0)
MCHC: 29.2 g/dL — ABNORMAL LOW (ref 30.0–36.0)
MCV: 89.9 fL (ref 80.0–100.0)
Platelets: 324 10*3/uL (ref 150–400)
RBC: 4.57 MIL/uL (ref 4.22–5.81)
RDW: 16.8 % — ABNORMAL HIGH (ref 11.5–15.5)
WBC: 10.2 10*3/uL (ref 4.0–10.5)
nRBC: 0 % (ref 0.0–0.2)

## 2018-05-15 LAB — MAGNESIUM: Magnesium: 2.1 mg/dL (ref 1.7–2.4)

## 2018-05-15 LAB — PHOSPHORUS: Phosphorus: 4.2 mg/dL (ref 2.5–4.6)

## 2018-05-15 MED ORDER — GENERIC EXTERNAL MEDICATION
Status: DC
Start: ? — End: 2018-05-15

## 2018-06-12 DEATH — deceased

## 2020-06-11 IMAGING — DX PORTABLE CHEST - 1 VIEW
1 series · 1 of 1 positions shown · non-contrast
Comparison: None.

CLINICAL DATA: Respiratory failure

EXAM:
PORTABLE CHEST 1 VIEW

[chest]
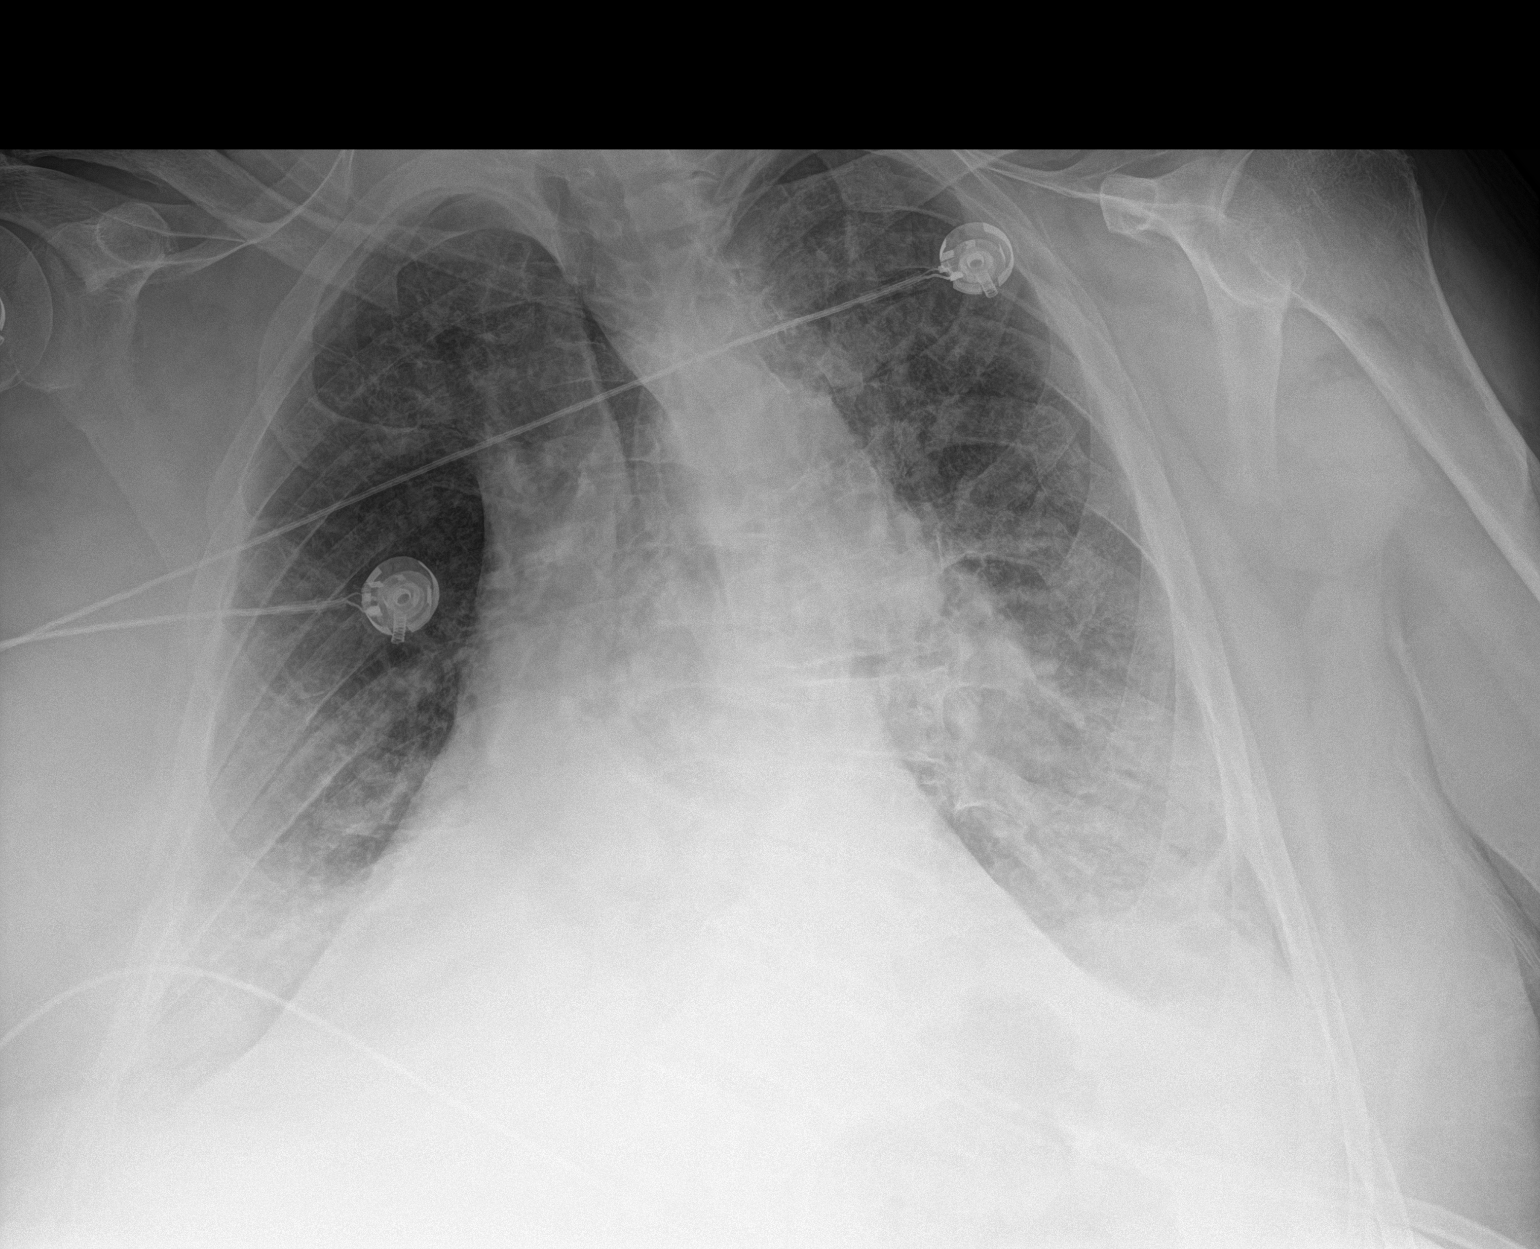

[1 of 1 positions shown; findings below may reference images not displayed]

FINDINGS: Patient is rotated and leaning to the RIGHT.

Cardiomegaly and pulmonary vascular congestion identified.

Moderate opacities overlying the LOWER lungs may represent
atelectasis, consolidation and/or effusions.

No pneumothorax.

No acute bony abnormality.  Cervical fusion hardware noted.
IMPRESSION: Cardiomegaly and pulmonary vascular congestion.

Moderate opacities overlying the LOWER lungs which may represent
atelectasis, consolidation and/or effusions.

## 2020-06-16 IMAGING — DX PORTABLE CHEST - 1 VIEW
1 series · 1 of 1 positions shown · non-contrast
Comparison: 04/25/2018

CLINICAL DATA: CHF and shortness of breath

EXAM:
PORTABLE CHEST 1 VIEW

[chest]
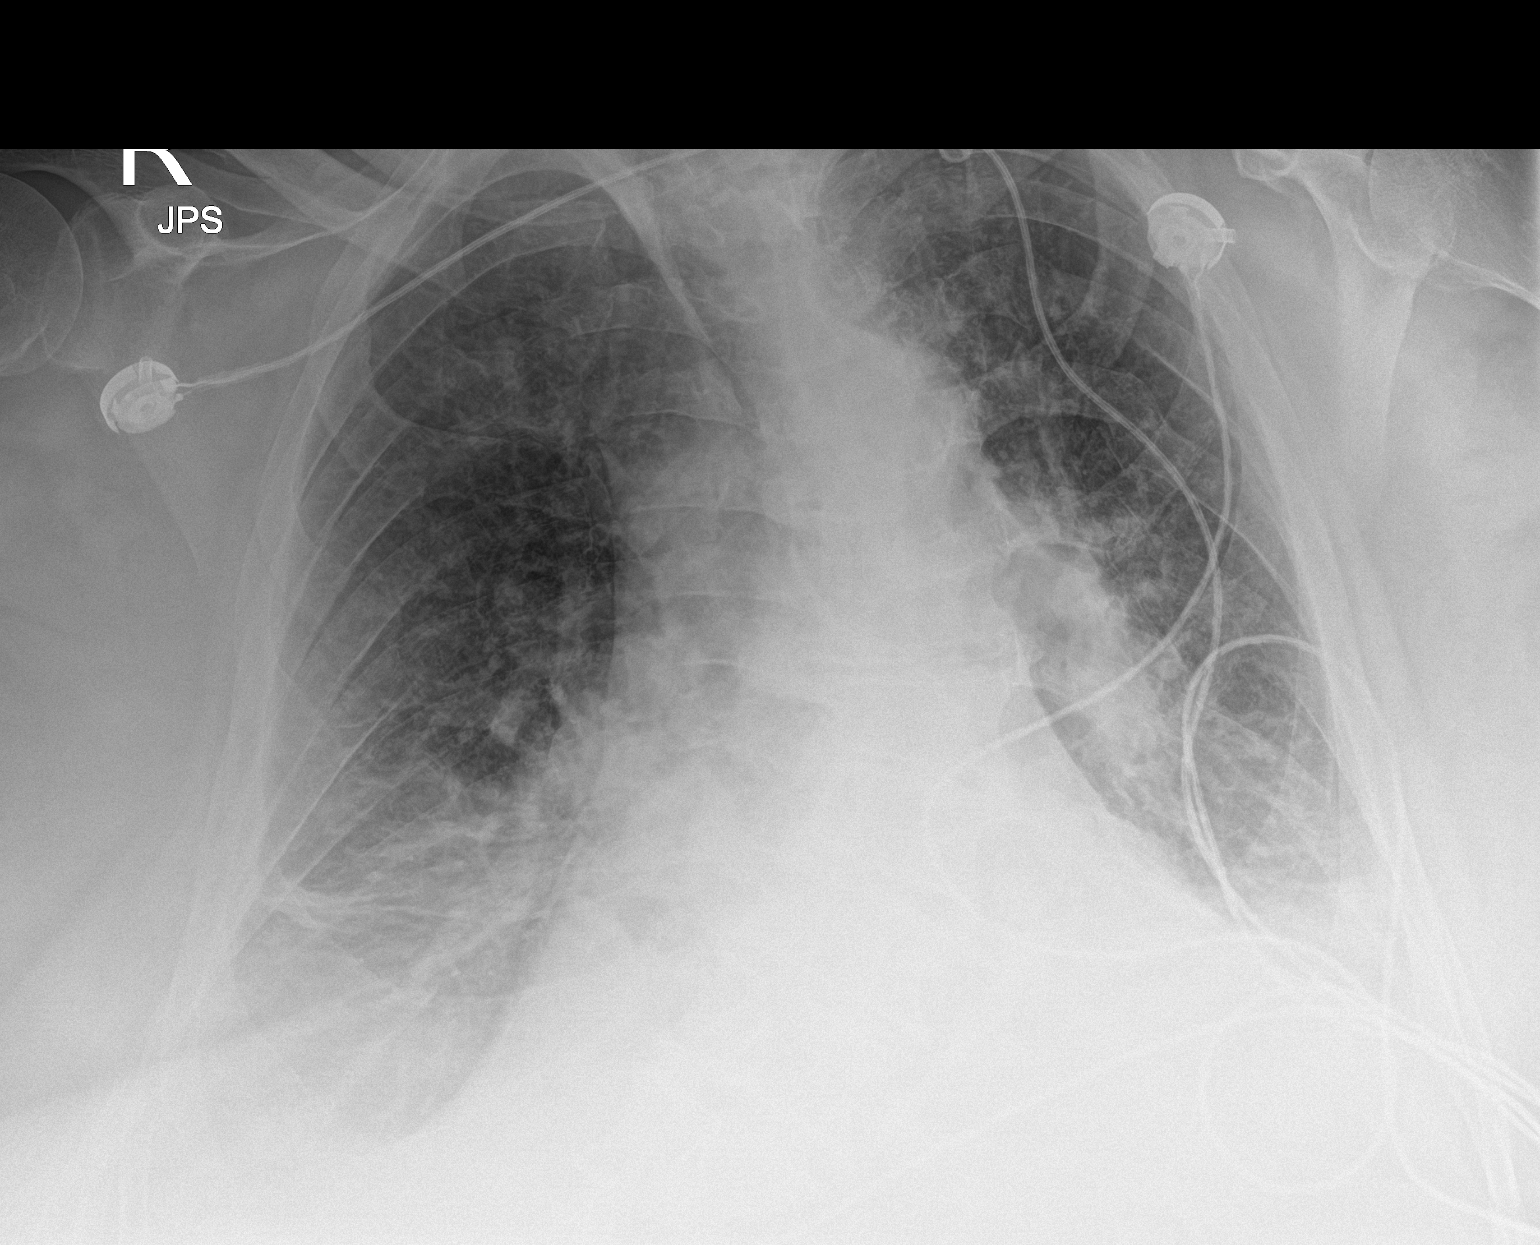

[1 of 1 positions shown; findings below may reference images not displayed]

FINDINGS: Cardiomegaly, pulmonary vascular congestion and bibasilar
opacities/atelectasis are not significantly changed.

Probable effusions again noted.

Little significant change from the prior study.

No pneumothorax or acute bony abnormality.
IMPRESSION: Little significant change in appearance of the chest with pulmonary
vascular congestion and bibasilar opacities/atelectasis and probable
effusions.

## 2020-06-28 IMAGING — DX PORTABLE CHEST - 1 VIEW
1 series · 1 of 1 positions shown · non-contrast
Comparison: 04/30/2018 chest radiograph.

CLINICAL DATA: Aspiration

EXAM:
PORTABLE CHEST 1 VIEW

[chest]
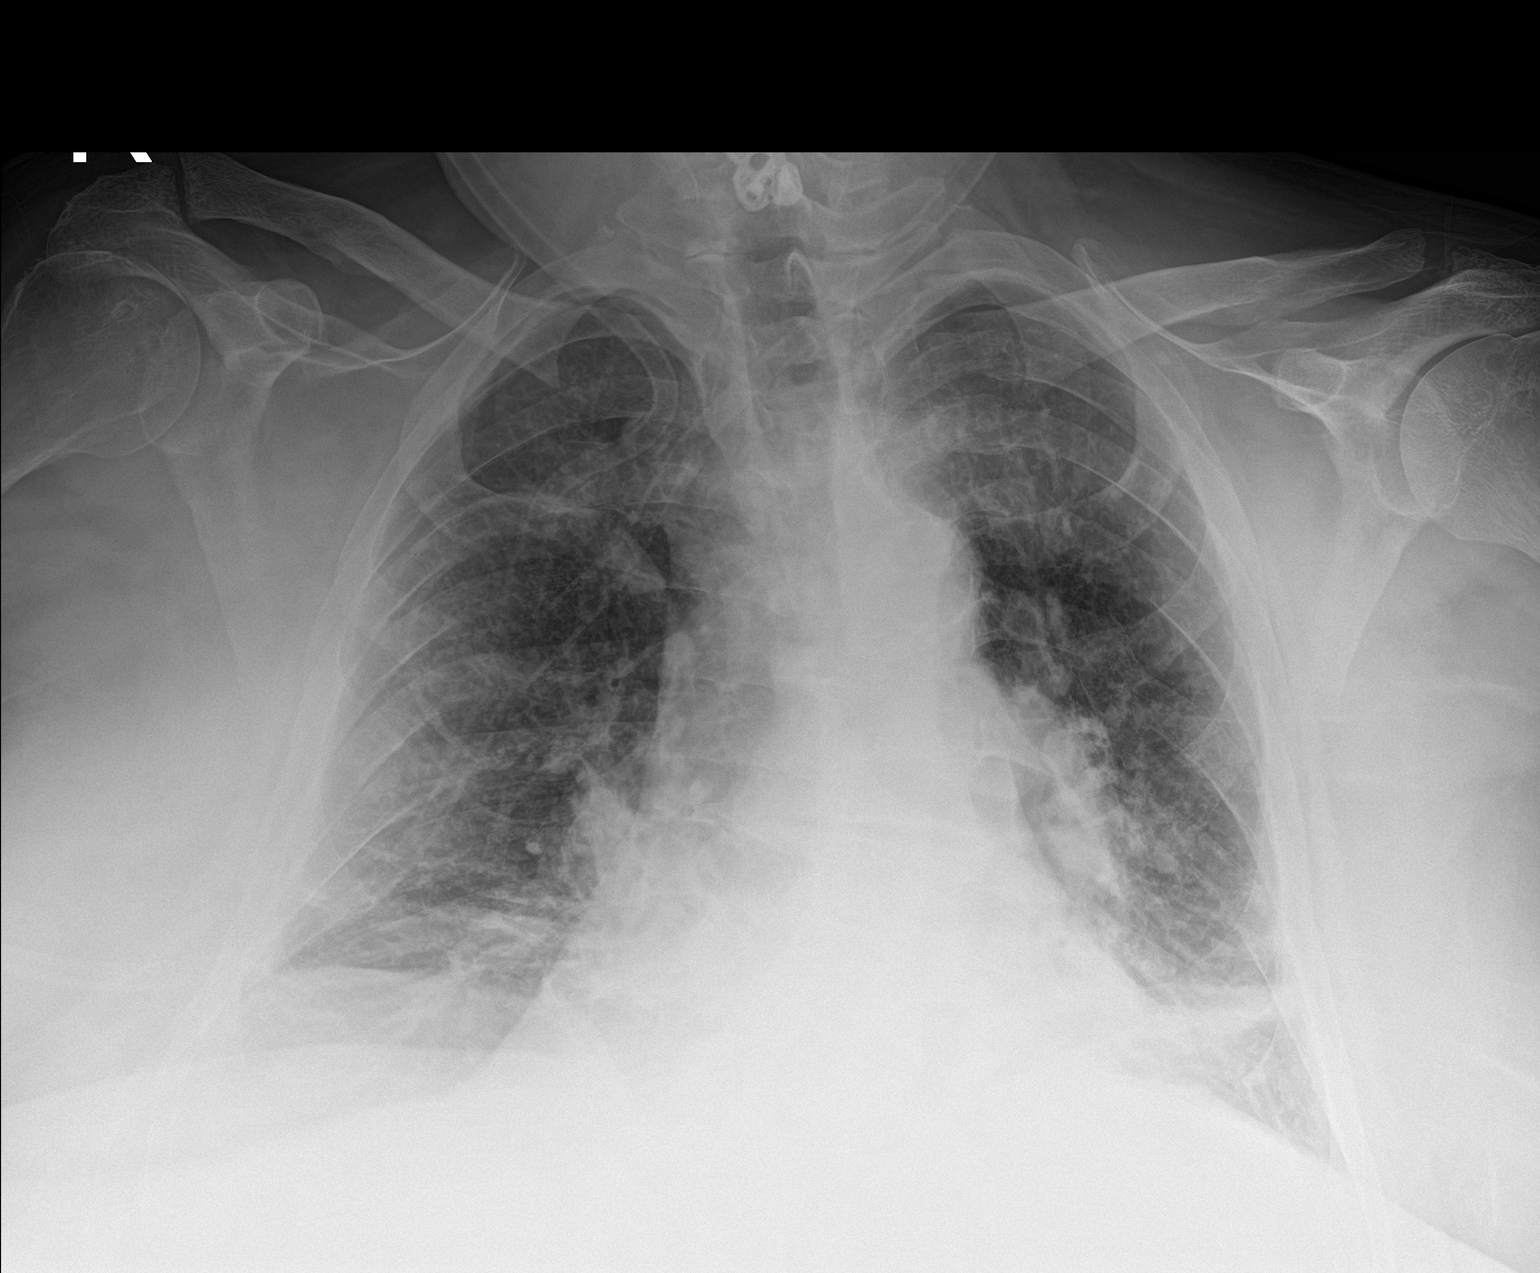

[1 of 1 positions shown; findings below may reference images not displayed]

FINDINGS: Surgical hardware from ACDF overlies the lower cervical spine.
Stable cardiomediastinal silhouette with mild cardiomegaly. No
pneumothorax. No pleural effusion. No overt pulmonary edema. Patchy
and linear bibasilar lung opacities are not substantially changed.
IMPRESSION: 1. Stable mild cardiomegaly without overt pulmonary edema.
2. No substantial change in patchy and linear bibasilar lung
opacities, which could represent atelectasis, aspiration and/or
pneumonia. Continued chest radiograph follow-up advised.
